# Patient Record
Sex: Female | Born: 1990 | Race: White | Hispanic: No | Marital: Single | Smoking: Former smoker
Health system: Southern US, Community
[De-identification: ages and names within clinical notes are randomized; demographics above are authoritative.]

## PROBLEM LIST (undated history)

## (undated) DIAGNOSIS — Z950 Presence of cardiac pacemaker: Secondary | ICD-10-CM

## (undated) DIAGNOSIS — Z954 Presence of other heart-valve replacement: Secondary | ICD-10-CM

## (undated) DIAGNOSIS — Z953 Presence of xenogenic heart valve: Secondary | ICD-10-CM

## (undated) DIAGNOSIS — F1990 Other psychoactive substance use, unspecified, uncomplicated: Secondary | ICD-10-CM

## (undated) DIAGNOSIS — Z8679 Personal history of other diseases of the circulatory system: Secondary | ICD-10-CM

## (undated) DIAGNOSIS — I509 Heart failure, unspecified: Secondary | ICD-10-CM

## (undated) DIAGNOSIS — Z87898 Personal history of other specified conditions: Secondary | ICD-10-CM

## (undated) HISTORY — PX: PACEMAKER INSERTION: SHX728

## (undated) HISTORY — PX: VALVE REPLACEMENT: SUR13

---

## 2016-02-24 ENCOUNTER — Emergency Department (HOSPITAL_COMMUNITY)
Admission: EM | Admit: 2016-02-24 | Discharge: 2016-02-25 | Disposition: A | Discharge status: Home / Self Care | Payer: Self-pay | Attending: Emergency Medicine | Admitting: Emergency Medicine

## 2016-02-24 ENCOUNTER — Emergency Department (HOSPITAL_COMMUNITY): Payer: Self-pay

## 2016-02-24 ENCOUNTER — Encounter (HOSPITAL_COMMUNITY): Payer: Self-pay | Admitting: Emergency Medicine

## 2016-02-24 DIAGNOSIS — F17210 Nicotine dependence, cigarettes, uncomplicated: Secondary | ICD-10-CM | POA: Insufficient documentation

## 2016-02-24 DIAGNOSIS — R079 Chest pain, unspecified: Secondary | ICD-10-CM | POA: Insufficient documentation

## 2016-02-24 DIAGNOSIS — R0602 Shortness of breath: Secondary | ICD-10-CM | POA: Insufficient documentation

## 2016-02-24 HISTORY — DX: Heart failure, unspecified: I50.9

## 2016-02-24 LAB — BASIC METABOLIC PANEL
ANION GAP: 9 (ref 5–15)
BUN: 17 mg/dL (ref 6–20)
CALCIUM: 9.3 mg/dL (ref 8.9–10.3)
CO2: 24 mmol/L (ref 22–32)
Chloride: 108 mmol/L (ref 101–111)
Creatinine, Ser: 0.75 mg/dL (ref 0.44–1.00)
Glucose, Bld: 97 mg/dL (ref 65–99)
POTASSIUM: 3.5 mmol/L (ref 3.5–5.1)
Sodium: 141 mmol/L (ref 135–145)

## 2016-02-24 LAB — I-STAT TROPONIN, ED: TROPONIN I, POC: 0 ng/mL (ref 0.00–0.08)

## 2016-02-24 LAB — CBC
HEMATOCRIT: 37.7 % (ref 36.0–46.0)
HEMOGLOBIN: 12.4 g/dL (ref 12.0–15.0)
MCH: 29.9 pg (ref 26.0–34.0)
MCHC: 32.9 g/dL (ref 30.0–36.0)
MCV: 90.8 fL (ref 78.0–100.0)
Platelets: 229 10*3/uL (ref 150–400)
RBC: 4.15 MIL/uL (ref 3.87–5.11)
RDW: 13.6 % (ref 11.5–15.5)
WBC: 8.1 10*3/uL (ref 4.0–10.5)

## 2016-02-24 LAB — BRAIN NATRIURETIC PEPTIDE: B Natriuretic Peptide: 36.7 pg/mL (ref 0.0–100.0)

## 2016-02-24 MED ORDER — GI COCKTAIL ~~LOC~~
30.0000 mL | Freq: Once | ORAL | Status: AC
  Administered 2016-02-25: 30 mL via ORAL
  Filled 2016-02-24: qty 30

## 2016-02-24 NOTE — ED Provider Notes (Signed)
Emergency Department Provider Note   I have reviewed the triage vital signs and the nursing notes.   HISTORY  Chief Complaint Chest Pain   HPI Courtney Dawson is a 26 y.o. female with PMH of CHF s/p valve repair with reported AICD presents to the ED with sudden onset CP and heart palpitations. The patient has not been compliant with CHF medications for the last 2 months because she "just stopped taking it." She reports some associated SOB. Symptoms started 1 hour PTA while watching TV and have been constant since. No exacerbating or alleviating factors. No fevers. CP is pleuritic. No history of PE but with does note a family history of blood clots, unsure of the exact diagnosis.   Past Medical History:  Diagnosis Date  . Heart failure (HCC)     There are no active problems to display for this patient.   History reviewed. No pertinent surgical history.    Allergies Contrast media [iodinated diagnostic agents] and Ceclor [cefaclor]  No family history on file.  Social History Social History  Substance Use Topics  . Smoking status: Current Some Day Smoker    Packs/day: 0.50    Types: Cigarettes  . Smokeless tobacco: Not on file  . Alcohol use No    Review of Systems  10-point ROS otherwise negative.  ____________________________________________   PHYSICAL EXAM:  VITAL SIGNS: ED Triage Vitals  Enc Vitals Group     BP 02/24/16 2200 109/80     Pulse Rate 02/24/16 2200 97     Resp 02/24/16 2200 (!) 28     Temp 02/24/16 2155 98.4 F (36.9 C)     Temp Source 02/24/16 2155 Oral     SpO2 02/24/16 2149 96 %     Weight 02/24/16 2200 120 lb (54.4 kg)     Height 02/24/16 2200 5' (1.524 m)     Pain Score 02/24/16 2200 8   Constitutional: Alert and oriented. Well appearing and in no acute distress. Eyes: Conjunctivae are normal.  Head: Atraumatic. Nose: No congestion/rhinnorhea. Mouth/Throat: Mucous membranes are moist.  Oropharynx non-erythematous. Neck: No  stridor.   Cardiovascular: Normal rate, regular rhythm. Good peripheral circulation. Grossly normal heart sounds.   Respiratory: Normal respiratory effort.  No retractions. Lungs CTAB. Gastrointestinal: Soft and nontender. No distention.  Musculoskeletal: No lower extremity tenderness nor edema. No gross deformities of extremities. Neurologic:  Normal speech and language. No gross focal neurologic deficits are appreciated.  Skin:  Skin is warm, dry and intact. No rash noted. Scar over neck and sternum.  Psychiatric: Mood and affect are normal. Speech and behavior are normal.  ____________________________________________   LABS (all labs ordered are listed, but only abnormal results are displayed)  Labs Reviewed  BASIC METABOLIC PANEL  CBC  BRAIN NATRIURETIC PEPTIDE  I-STAT TROPOININ, ED  I-STAT BETA HCG BLOOD, ED ([facility], [facility], [facility] ONLY)   ____________________________________________  EKG   EKG Interpretation  Date/Time:  Sunday February 24 2016 21:58:11 EST Ventricular Rate:  87 PR Interval:    QRS Duration: 104 QT Interval:  439 QTC Calculation: 529 R Axis:   108 Text Interpretation:  Sinus rhythm Probable left atrial enlargement Borderline right axis deviation RSR' in V1 or V2, probably normal variant Nonspecific T abnormalities, lateral leads Prolonged QT interval Baseline wander in lead(s) I II aVR aVL aVF V1 V2 V3 V4 V5 V6 No STEMI.  Confirmed by ZAMMIT  MD, JOSEPH [phone]) on 02/25/2016 3:26:39 PM       ____________________________________________  RADIOLOGY  CXR:     FINDINGS:  Right-sided duo lead pacing device. Post sternotomy changes with  valvular prosthesis. Additional linear leads overlie the lower  anterior chest. No acute consolidation or large pleural effusion.  There is mild cardiomegaly but no overt failure. There is no  pneumothorax.    IMPRESSION:  Mild cardiomegaly. No edema. No acute infiltrate.      Electronically Signed  By: Jasmine Pang M.D.  On: 02/24/2016 23:22    ____________________________________________   PROCEDURES  Procedure(s) performed:   Procedures  None ____________________________________________   INITIAL IMPRESSION / ASSESSMENT AND PLAN / ED COURSE  Pertinent labs & imaging results that were available during my care of the patient were reviewed by me and considered in my medical decision making (see chart for details).  Patient presents to the ED for evaluation of chest pain starting 1 hour PTA. Patient is in sinus rhythm on EKG. Patient has an extensive heart history that involves valve repair, CHF, and reported AICD/Paceamker. The patient reports seeing a Cardiologist at [facility] in the recent past but cannot recall the name of the physician or group. No records available on Care Everywhere. Last hospital prior to this was [facility] [place] with no records available there. Patient in no acute distress with no evidence of volume overload. No hypoxemia.  11:30 PM Patient with unremarkable labs and CXR. Reports continued CP worse with deep breathing. No history of blood clot but does note a family history. Patient with coughing and intermittent tachycardia. With family history and tachycardia along with pleuritic CP will obtain CT angio of the chest to evaluate for PE. Discussed this with the patient. If negative, would discharge home with Cardiology follow up.   12:07 AM Patient's PCP found to be Dr. Thera Flake. Patient med list updated by pharmacy.   Care transferred to Dr. Mora Bellman pending CTA. If negative for clot would consider discharge with patient plan to follow with Cardiology and PCP as an outpatient and re-start her medication. I spent a Jong Rickman time discussing her condition and the importance to maintaining good medical compliance to prevent further morbidity or mortality.  ____________________________________________  FINAL CLINICAL IMPRESSION(S) / ED DIAGNOSES  Final  diagnoses:  Nonspecific chest pain     MEDICATIONS GIVEN DURING THIS VISIT:  Medications  gi cocktail (Maalox,Lidocaine,Donnatal) (30 mLs Oral Given 02/25/16 0044)  hydrocortisone sodium succinate (SOLU-CORTEF) 100 MG injection 200 mg (200 mg Intravenous Given 02/25/16 0043)  diphenhydrAMINE (BENADRYL) injection 50 mg (50 mg Intravenous Given 02/25/16 0351)  iopamidol (ISOVUE-370) 76 % injection (100 mLs  Contrast Given 02/25/16 0453)     NEW OUTPATIENT MEDICATIONS STARTED DURING THIS VISIT:  There are no discharge medications for this patient.     Note:  This document was prepared using Dragon voice recognition software and may include unintentional dictation errors.  Alona Bene, MD Emergency Medicine   Maia Plan, MD 02/26/16 [phone]

## 2016-02-24 NOTE — ED Notes (Signed)
Patient transported to X-ray 

## 2016-02-24 NOTE — ED Triage Notes (Signed)
Tonight pt was sitting and experienced sudden onset left sided chest pain, w/ SOB.  She has an extensive cardiac history (2 valves replaced and heart failure w/ ECF of 30% including a demand pacemaker, however she has not been complaint w/ medications for two months.  She was given 324 ASA and 1 nitro which did not change the pressure of 8-10.  She has had a mildly productive cough for a few days.

## 2016-02-25 ENCOUNTER — Emergency Department (HOSPITAL_COMMUNITY): Payer: Self-pay

## 2016-02-25 LAB — I-STAT BETA HCG BLOOD, ED (MC, WL, AP ONLY)

## 2016-02-25 MED ORDER — HYDROCORTISONE NA SUCCINATE PF 100 MG IJ SOLR
200.0000 mg | Freq: Once | INTRAMUSCULAR | Status: AC
  Administered 2016-02-25: 200 mg via INTRAVENOUS
  Filled 2016-02-25: qty 4

## 2016-02-25 MED ORDER — IOPAMIDOL (ISOVUE-370) INJECTION 76%
INTRAVENOUS | Status: AC
  Administered 2016-02-25: 100 mL
  Filled 2016-02-25: qty 100

## 2016-02-25 MED ORDER — DIPHENHYDRAMINE HCL 50 MG/ML IJ SOLN
50.0000 mg | Freq: Once | INTRAMUSCULAR | Status: AC
  Administered 2016-02-25: 50 mg via INTRAVENOUS
  Filled 2016-02-25: qty 1

## 2016-02-25 NOTE — ED Provider Notes (Signed)
Patient signed out to me pending CTA for evaluation of PE.  CT results are negative for clots.  She has been stable in the ED throughout the night without any changed to her hemodynamics.  Plan to DC home with follow up.  She appears well and in NAD. VS remain within her normal limits and she is safe for DC.   Tomasita CrumbleAdeleke Dyland Panuco, MD 02/25/16 [phone]

## 2016-02-25 NOTE — ED Notes (Signed)
Spoke to pt several times about the importance of leaving the vital monitoring equipment on.  Several times I have entered the room and found that she has removed the equipment.  Requested that she please leave them on.

## 2016-02-25 NOTE — ED Notes (Signed)
Pt given cab voucher to get home safely

## 2016-02-25 NOTE — Discharge Instructions (Addendum)
You were seen in the ED today with chest pain. The CT scan and lab work was reassuring and did not show any blood clots in your lungs. You need to restart your mediations immediately and follow up with your PCP and Cardiology teams ASAP.   You should come back to the ED immediately with any new/worsening chest pain, difficulty breathing, heart palpitations, or other concerning symptoms.

## 2016-02-25 NOTE — ED Notes (Signed)
Pt back from CT, Tech states pt tolerated procedure well.

## 2016-05-10 ENCOUNTER — Emergency Department (HOSPITAL_BASED_OUTPATIENT_CLINIC_OR_DEPARTMENT_OTHER): Payer: 59

## 2016-05-10 ENCOUNTER — Emergency Department (HOSPITAL_COMMUNITY)
Admission: EM | Admit: 2016-05-10 | Discharge: 2016-05-10 | Disposition: A | Discharge status: Home / Self Care | Payer: 59 | Attending: Emergency Medicine | Admitting: Emergency Medicine

## 2016-05-10 ENCOUNTER — Emergency Department (HOSPITAL_COMMUNITY): Payer: 59

## 2016-05-10 ENCOUNTER — Encounter (HOSPITAL_COMMUNITY): Payer: Self-pay | Admitting: Emergency Medicine

## 2016-05-10 DIAGNOSIS — R0602 Shortness of breath: Secondary | ICD-10-CM

## 2016-05-10 DIAGNOSIS — F17210 Nicotine dependence, cigarettes, uncomplicated: Secondary | ICD-10-CM | POA: Insufficient documentation

## 2016-05-10 DIAGNOSIS — Z3A01 Less than 8 weeks gestation of pregnancy: Secondary | ICD-10-CM | POA: Insufficient documentation

## 2016-05-10 DIAGNOSIS — R072 Precordial pain: Secondary | ICD-10-CM

## 2016-05-10 DIAGNOSIS — O9989 Other specified diseases and conditions complicating pregnancy, childbirth and the puerperium: Secondary | ICD-10-CM | POA: Insufficient documentation

## 2016-05-10 DIAGNOSIS — O99331 Smoking (tobacco) complicating pregnancy, first trimester: Secondary | ICD-10-CM | POA: Insufficient documentation

## 2016-05-10 DIAGNOSIS — I360 Nonrheumatic tricuspid (valve) stenosis: Secondary | ICD-10-CM | POA: Diagnosis not present

## 2016-05-10 DIAGNOSIS — R079 Chest pain, unspecified: Secondary | ICD-10-CM | POA: Insufficient documentation

## 2016-05-10 DIAGNOSIS — I509 Heart failure, unspecified: Secondary | ICD-10-CM | POA: Insufficient documentation

## 2016-05-10 LAB — CBC
HCT: 39.9 % (ref 36.0–46.0)
HEMOGLOBIN: 13.2 g/dL (ref 12.0–15.0)
MCH: 30 pg (ref 26.0–34.0)
MCHC: 33.1 g/dL (ref 30.0–36.0)
MCV: 90.7 fL (ref 78.0–100.0)
PLATELETS: 223 10*3/uL (ref 150–400)
RBC: 4.4 MIL/uL (ref 3.87–5.11)
RDW: 13.5 % (ref 11.5–15.5)
WBC: 7.3 10*3/uL (ref 4.0–10.5)

## 2016-05-10 LAB — BASIC METABOLIC PANEL
ANION GAP: 8 (ref 5–15)
BUN: 11 mg/dL (ref 6–20)
CALCIUM: 9 mg/dL (ref 8.9–10.3)
CO2: 21 mmol/L — AB (ref 22–32)
CREATININE: 0.53 mg/dL (ref 0.44–1.00)
Chloride: 106 mmol/L (ref 101–111)
Glucose, Bld: 90 mg/dL (ref 65–99)
Potassium: 3.8 mmol/L (ref 3.5–5.1)
SODIUM: 135 mmol/L (ref 135–145)

## 2016-05-10 LAB — TROPONIN I

## 2016-05-10 LAB — ECHOCARDIOGRAM COMPLETE

## 2016-05-10 LAB — I-STAT TROPONIN, ED: Troponin i, poc: 0 ng/mL (ref 0.00–0.08)

## 2016-05-10 LAB — BRAIN NATRIURETIC PEPTIDE: B NATRIURETIC PEPTIDE 5: 54.5 pg/mL (ref 0.0–100.0)

## 2016-05-10 LAB — D-DIMER, QUANTITATIVE: D-Dimer, Quant: 0.36 ug/mL-FEU (ref 0.00–0.50)

## 2016-05-10 NOTE — Discharge Instructions (Signed)
Please continue taking all of her cardiac medications especially your Lasix, digoxin, Coreg. Please call her cardiologist asap for close follow up Monday or Tuesday

## 2016-05-10 NOTE — ED Triage Notes (Signed)
Pt c/o CP since this morning, c/o trouble breathing, pt states she has heart failure.

## 2016-05-10 NOTE — Progress Notes (Signed)
  Echocardiogram 2D Echocardiogram has been performed.  Arvil ChacoFoster, Maven Varelas 05/10/2016, 3:07 PM

## 2016-05-10 NOTE — ED Notes (Signed)
Pt ambulated in hallway, sats 98% RA, 98bpm.

## 2016-05-10 NOTE — ED Provider Notes (Signed)
Hx of IVDU. Complex cardiac history. Here with sharp pleuritic cp. Labs negative. Pregnant 1 month. Needs echo   Echo with mild RV dilation and possible paradoxical septal movement. Discussed this with cardiology extensively who does not believe any further testing or evaluation is needed at this time. Patient is feeling better and is ambulatory on room air without tachycardia and maintaining her saturations. Given lab values I do not believe she has ACS or PE. She does tell me that she has stopped all of her medications which include Lasix, digoxin, Coreg. She has not followed up with her heart failure team in regards to this. I advised that she needs to have a good conversation with her heart failure team first day next week. She does feel comfortable discharge at this time. We'll follow up on Monday or Tuesday with her cardiologist.  I have reviewed all labs and workup. Patient stable for discharge home.  I have reviewed all results with the patient. Patient agrees to stated plan. All questions answered. Advised to call or return to have any questions, new symptoms, change in symptoms, or symptoms that they do not understand.    Courtney Dawson, Courtney Ringle, MD 05/11/16 1714    Blane OharaZavitz, Joshua, MD 05/12/16 [phone]

## 2016-05-10 NOTE — ED Provider Notes (Signed)
[facility]ENCY DEPT Provider Note   CSN: [id number] Arrival date & time: 05/10/16  [id number]     History   Chief Complaint Chief Complaint  Patient presents with  . Chest Pain    HPI Courtney Dawson is a 26 y.o. female  With a pmh of IVDU, currently resisding in a rehab facility who is currently about 1 month pregnant. 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 She had trach and ped removed after discharge. In 05/2015 she was admitted for biventricular HF, was diuresed and last echo shoed EF of 35-40%.    The patient presents today with cc of cp. She states that she awoke with cp that she states is sharp and worse with breathing She says that it is different from her heart failure and different from the normal cp she has after the multiple surgeries. She denies Hemoptysis or UL leg swelling. She denies    HPI  Past Medical History:  Diagnosis Date  . Heart failure (HCC)     There are no active problems to display for this patient.   History reviewed. No pertinent surgical history.  OB History    Gravida Para Term Preterm AB Living   1             SAB TAB Ectopic Multiple Live Births                   Home Medications    Prior to Admission medications   Not on File    Family History No family history on file.  Social History Social History  Substance Use Topics  . Smoking status: Current Some Day Smoker    Packs/day: 0.50    Types: Cigarettes  . Smokeless tobacco: Not on file  . Alcohol use No     Allergies   Contrast media [iodinated diagnostic agents] and Ceclor [cefaclor]   Review of Systems Review of Systems  Ten systems reviewed and are negative for acute change, except as noted in the HPI.   Physical Exam Updated Vital Signs BP 96/64 (BP Location: Left Arm)   Pulse 97   Temp 98.4 F  (36.9 C) (Oral)   Resp 20   LMP 02/16/2016 (Exact Date)   SpO2 98%   Physical Exam  Constitutional: She is oriented to person, place, and time. She appears well-developed and well-nourished. No distress.  HENT:  Head: Normocephalic and atraumatic.  Eyes: Conjunctivae are normal. No scleral icterus.  Neck: Normal range of motion.  Cardiovascular: Normal rate, regular rhythm and normal heart sounds.  Exam reveals no gallop and no friction rub.   No murmur heard. Pulmonary/Chest: Effort normal and breath sounds normal. No respiratory distress.  Abdominal: Soft. Bowel sounds are normal. She exhibits no distension and no mass. There is no tenderness. There is no guarding.  Neurological: She is alert and oriented to person, place, and time.  Skin: Skin is warm and dry. She is not diaphoretic.  Psychiatric: Her behavior is normal.  Nursing note and vitals reviewed.    ED Treatments / Results  Labs (all labs ordered are listed, but only abnormal results are displayed) Labs Reviewed  BASIC METABOLIC PANEL  CBC  BRAIN NATRIURETIC PEPTIDE  D-DIMER, QUANTITATIVE (NOT [facility])  I-STAT TROPOININ, ED    EKG  EKG Interpretation None       Radiology  No results found.  Procedures Procedures (including critical care time)  Medications Ordered in ED Medications - No data to display   Initial Impression / Assessment and Plan / ED Course  I have reviewed the triage vital signs and the nursing notes.  Pertinent labs & imaging results that were available during my care of the patient were reviewed by me and considered in my medical decision making (see chart for details).       High risk cp patient work up is negative.  Stat echocardiogram is currently pending. Have given sign out to Dr. Armond Hang. He'll assume care of the patient. We will get a repeat troponin and patient disposition will be based on file. Echocardiogram results. Final Clinical Impressions(s) / ED Diagnoses    Final diagnoses:  None    New Prescriptions New Prescriptions   No medications on file     Arthor Captain, PA-C 05/10/16 1630    Tegeler, Canary Brim, MD 05/10/16 [phone]

## 2016-05-10 NOTE — ED Triage Notes (Signed)
Pt states she is 1 month pregnant but not planning on keeping it.

## 2016-05-27 ENCOUNTER — Encounter (HOSPITAL_COMMUNITY): Payer: Self-pay

## 2016-05-27 ENCOUNTER — Emergency Department (HOSPITAL_COMMUNITY): Payer: 59

## 2016-05-27 DIAGNOSIS — I509 Heart failure, unspecified: Secondary | ICD-10-CM | POA: Diagnosis not present

## 2016-05-27 DIAGNOSIS — Z79899 Other long term (current) drug therapy: Secondary | ICD-10-CM | POA: Diagnosis not present

## 2016-05-27 DIAGNOSIS — Z7901 Long term (current) use of anticoagulants: Secondary | ICD-10-CM | POA: Insufficient documentation

## 2016-05-27 DIAGNOSIS — F17210 Nicotine dependence, cigarettes, uncomplicated: Secondary | ICD-10-CM | POA: Diagnosis not present

## 2016-05-27 DIAGNOSIS — R0789 Other chest pain: Secondary | ICD-10-CM | POA: Diagnosis not present

## 2016-05-27 DIAGNOSIS — R091 Pleurisy: Secondary | ICD-10-CM | POA: Diagnosis not present

## 2016-05-27 DIAGNOSIS — Z950 Presence of cardiac pacemaker: Secondary | ICD-10-CM | POA: Diagnosis not present

## 2016-05-27 DIAGNOSIS — Z7982 Long term (current) use of aspirin: Secondary | ICD-10-CM | POA: Insufficient documentation

## 2016-05-27 DIAGNOSIS — R05 Cough: Secondary | ICD-10-CM | POA: Insufficient documentation

## 2016-05-27 LAB — BASIC METABOLIC PANEL
Anion gap: 8 (ref 5–15)
BUN: 12 mg/dL (ref 6–20)
CHLORIDE: 107 mmol/L (ref 101–111)
CO2: 22 mmol/L (ref 22–32)
CREATININE: 0.85 mg/dL (ref 0.44–1.00)
Calcium: 9.2 mg/dL (ref 8.9–10.3)
GFR calc Af Amer: >60 mL/min (ref >60)
GLUCOSE: 104 mg/dL — AB (ref 65–99)
Potassium: 4.1 mmol/L (ref 3.5–5.1)
SODIUM: 137 mmol/L (ref 135–145)

## 2016-05-27 LAB — CBC WITH DIFFERENTIAL/PLATELET
Basophils Absolute: 0 10*3/uL (ref 0.0–0.1)
Basophils Relative: 0 %
EOS ABS: 0.2 10*3/uL (ref 0.0–0.7)
EOS PCT: 3 %
HCT: 38.2 % (ref 36.0–46.0)
Hemoglobin: 12.5 g/dL (ref 12.0–15.0)
LYMPHS ABS: 2.8 10*3/uL (ref 0.7–4.0)
Lymphocytes Relative: 31 %
MCH: 29.6 pg (ref 26.0–34.0)
MCHC: 32.7 g/dL (ref 30.0–36.0)
MCV: 90.5 fL (ref 78.0–100.0)
MONOS PCT: 6 %
Monocytes Absolute: 0.6 10*3/uL (ref 0.1–1.0)
Neutro Abs: 5.5 10*3/uL (ref 1.7–7.7)
Neutrophils Relative %: 60 %
PLATELETS: 246 10*3/uL (ref 150–400)
RBC: 4.22 MIL/uL (ref 3.87–5.11)
RDW: 13.4 % (ref 11.5–15.5)
WBC: 9.1 10*3/uL (ref 4.0–10.5)

## 2016-05-27 LAB — BRAIN NATRIURETIC PEPTIDE: B Natriuretic Peptide: 34.8 pg/mL (ref 0.0–100.0)

## 2016-05-27 LAB — I-STAT TROPONIN, ED: TROPONIN I, POC: 0.01 ng/mL (ref 0.00–0.08)

## 2016-05-27 LAB — D-DIMER, QUANTITATIVE: D-Dimer, Quant: 0.42 ug/mL-FEU (ref 0.00–0.50)

## 2016-05-27 NOTE — ED Triage Notes (Signed)
Pt presents with sudden onset of L sided chest pain while she was at work.  Pt reports shortness of breath, reports feeling abdominal bloating.  Pt reports medicine induced abortion x 1 week ago, reports pacemaker placement 1 year ago, doesn't remember the last time it was interrogated.  +dry cough.

## 2016-05-28 ENCOUNTER — Emergency Department (HOSPITAL_COMMUNITY)
Admission: EM | Admit: 2016-05-28 | Discharge: 2016-05-28 | Disposition: A | Discharge status: Home / Self Care | Payer: 59 | Attending: Emergency Medicine | Admitting: Emergency Medicine

## 2016-05-28 DIAGNOSIS — R0789 Other chest pain: Secondary | ICD-10-CM

## 2016-05-28 DIAGNOSIS — R091 Pleurisy: Secondary | ICD-10-CM

## 2016-05-28 MED ORDER — BENZONATATE 100 MG PO CAPS: 100.0000 mg | ORAL_CAPSULE | Freq: Three times a day (TID) | ORAL | 0 refills | Status: DC

## 2016-05-28 MED ORDER — ALBUTEROL SULFATE HFA 108 (90 BASE) MCG/ACT IN AERS: 2.0000 | INHALATION_SPRAY | RESPIRATORY_TRACT | 2 refills | Status: DC | PRN

## 2016-05-28 MED ORDER — PREDNISONE 20 MG PO TABS: 40.0000 mg | ORAL_TABLET | Freq: Every day | ORAL | 0 refills | Status: DC

## 2016-05-28 MED ORDER — HYDROCODONE-HOMATROPINE 5-1.5 MG/5ML PO SYRP: 5.0000 mL | ORAL_SOLUTION | Freq: Once | ORAL | Status: DC

## 2016-05-28 MED ORDER — PREDNISONE 20 MG PO TABS
60.0000 mg | ORAL_TABLET | Freq: Once | ORAL | Status: AC
  Administered 2016-05-28: 60 mg via ORAL
  Filled 2016-05-28: qty 3

## 2016-05-28 MED ORDER — ALBUTEROL SULFATE (2.5 MG/3ML) 0.083% IN NEBU
5.0000 mg | INHALATION_SOLUTION | Freq: Once | RESPIRATORY_TRACT | Status: AC
  Administered 2016-05-28: 5 mg via RESPIRATORY_TRACT
  Filled 2016-05-28: qty 6

## 2016-05-28 MED ORDER — KETOROLAC TROMETHAMINE 30 MG/ML IJ SOLN
30.0000 mg | Freq: Once | INTRAMUSCULAR | Status: AC
  Administered 2016-05-28: 30 mg via INTRAMUSCULAR
  Filled 2016-05-28: qty 1

## 2016-05-28 NOTE — ED Provider Notes (Signed)
[facility]-EMERGENCY DEPT Provider Note   CSN: [id number] Arrival date & time: 05/27/16  1658  By signing my name below, I, Courtney Dawson, attest that this documentation has been prepared under the direction and in the presence of Tandi Spilde, Canary Brim, *. Electronically Signed: Karren Cobble, ED Scribe. 05/28/16. 2:19 AM.   History   Chief Complaint Chief Complaint  Patient presents with  . Chest Pain   The history is provided by the patient. No language interpreter was used.    HPI Comments: Courtney Dawson is a 26 y.o. female with a PMHx of heart failure and pacemaker, who presents to the Emergency Department complaining of sudden onset of lower left sided sharp and heavy chest pain that started today.  Pt notes an associated cough that started three weeks ago and back pain. She reports seeing her cardiologist for her cough previously but denies the presence of chest pain at that time. She also notes pain near her pacemaker site. Pain is worsened with coughing and while laying flat.No treatment tried PTA. No alleviating factors. Denies worsening of symptoms with movement or palpation of the area. Denies shortness of breath.    Past Medical History:  Diagnosis Date  . Heart failure (HCC)    There are no active problems to display for this patient.  History reviewed. No pertinent surgical history.  OB History    Gravida Para Term Preterm AB Living   1             SAB TAB Ectopic Multiple Live Births                   Home Medications    Prior to Admission medications   Medication Sig Start Date End Date Taking? Authorizing Provider  albuterol (PROVENTIL HFA;VENTOLIN HFA) 108 (90 Base) MCG/ACT inhaler Inhale 1-2 puffs into the lungs every 6 (six) hours as needed for wheezing or shortness of breath.   Yes [provider]  aspirin EC 81 MG tablet Take 81 mg by mouth daily.   Yes [provider]  carvedilol (COREG) 12.5 MG tablet Take 18.75 mg by mouth 2 (two) times  daily with a meal.   Yes [provider]  digoxin (LANOXIN) 0.125 MG tablet Take 0.125 mg by mouth every Monday, Wednesday, and Friday.   Yes [provider]  ferrous sulfate 325 (65 FE) MG tablet Take 325 mg by mouth daily with breakfast.   Yes [provider]  furosemide (LASIX) 20 MG tablet Take 20 mg by mouth daily.   Yes [provider]  lamoTRIgine (LAMICTAL) 100 MG tablet Take 100 mg by mouth daily.   Yes [provider]  lisinopril (PRINIVIL,ZESTRIL) 2.5 MG tablet Take 2.5 mg by mouth daily.   Yes [provider]  Multiple Vitamin (MULTIVITAMIN WITH MINERALS) TABS tablet Take 1 tablet by mouth daily.   Yes [provider]  sertraline (ZOLOFT) 100 MG tablet Take 100 mg by mouth daily.   Yes [provider]  albuterol (PROVENTIL HFA;VENTOLIN HFA) 108 (90 Base) MCG/ACT inhaler Inhale 2 puffs into the lungs every 4 (four) hours as needed for wheezing or shortness of breath. 05/28/16   Courtney Dawson, Canary Brim, MD  benzonatate (TESSALON) 100 MG capsule Take 1 capsule (100 mg total) by mouth every 8 (eight) hours. 05/28/16   Courtney Crease, MD  predniSONE (DELTASONE) 20 MG tablet Take 2 tablets (40 mg total) by mouth daily with breakfast. 05/28/16   Courtney Dawson, Canary Brim, MD  Family History History reviewed. No pertinent family history.  Social History Social History  Substance Use Topics  . Smoking status: Current Some Day Smoker    Packs/day: 0.50    Types: Cigarettes  . Smokeless tobacco: Never Used  . Alcohol use No     Allergies   Contrast media [iodinated diagnostic agents] and Ceclor [cefaclor]   Review of Systems Review of Systems  Respiratory: Positive for cough. Negative for shortness of breath.   Cardiovascular: Positive for chest pain.  Musculoskeletal: Positive for back pain.  All other systems reviewed and are negative.    Physical Exam Updated Vital Signs BP 102/67 (BP Location:  Right Arm)   Pulse 74   Temp 99 F (37.2 C) (Oral)   Resp 16   Ht 5' (1.524 m)   Wt 54.4 kg (120 lb)   LMP 02/16/2016 (Exact Date)   SpO2 100%   Breastfeeding? No Comment: pt not currently pregnant  BMI 23.44 kg/m   Physical Exam  Constitutional: She is oriented to person, place, and time. She appears well-developed and well-nourished. No distress.  HENT:  Head: Normocephalic and atraumatic.  Right Ear: Hearing normal.  Left Ear: Hearing normal.  Nose: Nose normal.  Mouth/Throat: Oropharynx is clear and moist and mucous membranes are normal.  Eyes: Conjunctivae and EOM are normal. Pupils are equal, round, and reactive to light.  Neck: Normal range of motion. Neck supple.  Cardiovascular: Regular rhythm, S1 normal and S2 normal.  Exam reveals no gallop and no friction rub.   No murmur heard. Pulmonary/Chest: Effort normal and breath sounds normal. No respiratory distress. She exhibits no tenderness.  Scattered rhonchi.   Abdominal: Soft. Normal appearance and bowel sounds are normal. There is no hepatosplenomegaly. There is no tenderness. There is no rebound, no guarding, no tenderness at McBurney's point and negative Murphy's sign. No hernia.  Musculoskeletal: Normal range of motion.  Neurological: She is alert and oriented to person, place, and time. She has normal strength. No cranial nerve deficit or sensory deficit. Coordination normal. GCS eye subscore is 4. GCS verbal subscore is 5. GCS motor subscore is 6.  Skin: Skin is warm, dry and intact. No rash noted. No cyanosis.  Psychiatric: She has a normal mood and affect. Her speech is normal and behavior is normal. Thought content normal.  Nursing note and vitals reviewed.    ED Treatments / Results  DIAGNOSTIC STUDIES: Oxygen Saturation is 100% on RA, normal by my interpretation.   COORDINATION OF CARE: 1:08 AM-Discussed next steps with pt. Pt verbalized understanding and is agreeable with the plan.   Labs (all labs  ordered are listed, but only abnormal results are displayed) Labs Reviewed  BASIC METABOLIC PANEL - Abnormal; Notable for the following:       Result Value   Glucose, Bld 104 (*)    All other components within normal limits  CBC WITH DIFFERENTIAL/PLATELET  D-DIMER, QUANTITATIVE (NOT AT [facility])  BRAIN NATRIURETIC PEPTIDE  I-STAT TROPOININ, ED    EKG  EKG Interpretation  Date/Time:  Tuesday May 27 2016 17:03:28 EDT Ventricular Rate:  106 PR Interval:  218 QRS Duration: 84 QT Interval:  340 QTC Calculation: 451 R Axis:   113 Text Interpretation:  Sinus tachycardia with 1st degree A-V block Right axis deviation Nonspecific T wave abnormality Abnormal ECG No significant change since last tracing Confirmed by Courtney Dawson [phone]) on 05/28/2016 12:58:50 AM       Radiology Dg Chest 2 View  Result  Date: 05/27/2016 CLINICAL DATA:  Chest pain EXAM: CHEST  2 VIEW COMPARISON:  05/10/2016 FINDINGS: Mitral valve and tricuspid valve replacement. Duo lead pacemaker unchanged. Heart size mildly enlarged. Negative for heart failure. Lungs are clear without infiltrate or effusion. IMPRESSION: No active cardiopulmonary disease. Electronically Signed   By: Marlan Palauharles  Clark M.D.   On: 05/27/2016 17:58    Procedures Procedures (including critical care time)  Medications Ordered in ED Medications  predniSONE (DELTASONE) tablet 60 mg (60 mg Oral Given 05/28/16 0133)  albuterol (PROVENTIL) (2.5 MG/3ML) 0.083% nebulizer solution 5 mg (5 mg Nebulization Given 05/28/16 0133)  ketorolac (TORADOL) 30 MG/ML injection 30 mg (30 mg Intramuscular Given 05/28/16 0131)     Initial Impression / Assessment and Plan / ED Course  I have reviewed the triage vital signs and the nursing notes.  Pertinent labs & imaging results that were available during my care of the patient were reviewed by me and considered in my medical decision making (see chart for details).     Patient presents to the emergency  department with concerns over chest pain. This is one of multiple visits for this complaint recently. 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 She reports pain associated with cough. She does have a violent and frequent cough during the examination. This is most likely resulting in chest wall and lung inflammation, possibly pleurisy. Chest x-ray, however, is negative. No evidence of pneumonia. Troponin negative. No evidence of heart failure by x-ray, examination or BNP. D-dimer also normal. Patient recently had cardiac echo during her previous ER evaluation that did not show any significant abnormality. There is nothing to indicate cardiac etiology of her chest pain. Will treat with prednisone and bronchodilator therapy.  Of note, patient reports early pregnancy. She reports intent to terminate pregnancy. It was therefore felt that she could be treated with anti-inflammatory medication for her chest wall pain.  Final Clinical Impressions(s) / ED Diagnoses   Final diagnoses:  Chest wall pain  Pleurisy    New Prescriptions New Prescriptions   ALBUTEROL (PROVENTIL HFA;VENTOLIN HFA) 108 (90 BASE) MCG/ACT INHALER    Inhale 2 puffs into the lungs every 4 (four) hours as needed for wheezing or shortness of breath.   BENZONATATE (TESSALON) 100 MG CAPSULE    Take 1 capsule (100 mg total) by mouth every 8 (eight) hours.   PREDNISONE (DELTASONE) 20 MG TABLET    Take 2 tablets (40 mg total) by mouth daily with breakfast.   I personally performed the services described in this documentation, which was scribed in my presence. The recorded information has been reviewed and is accurate.     Courtney CreasePollina, Schae Cando J, MD 05/28/16 [phone]

## 2016-07-05 ENCOUNTER — Encounter (HOSPITAL_COMMUNITY): Payer: Self-pay | Admitting: Emergency Medicine

## 2016-07-05 ENCOUNTER — Emergency Department (HOSPITAL_COMMUNITY): Payer: 59

## 2016-07-05 ENCOUNTER — Emergency Department (HOSPITAL_COMMUNITY)
Admission: EM | Admit: 2016-07-05 | Discharge: 2016-07-05 | Disposition: A | Discharge status: Home / Self Care | Payer: 59 | Attending: Emergency Medicine | Admitting: Emergency Medicine

## 2016-07-05 DIAGNOSIS — J9801 Acute bronchospasm: Secondary | ICD-10-CM

## 2016-07-05 DIAGNOSIS — Z7982 Long term (current) use of aspirin: Secondary | ICD-10-CM | POA: Insufficient documentation

## 2016-07-05 DIAGNOSIS — Z79899 Other long term (current) drug therapy: Secondary | ICD-10-CM | POA: Insufficient documentation

## 2016-07-05 DIAGNOSIS — J209 Acute bronchitis, unspecified: Secondary | ICD-10-CM

## 2016-07-05 DIAGNOSIS — F17210 Nicotine dependence, cigarettes, uncomplicated: Secondary | ICD-10-CM | POA: Insufficient documentation

## 2016-07-05 DIAGNOSIS — R0789 Other chest pain: Secondary | ICD-10-CM

## 2016-07-05 LAB — CBC
HEMATOCRIT: 37 % (ref 36.0–46.0)
Hemoglobin: 12.4 g/dL (ref 12.0–15.0)
MCH: 30.3 pg (ref 26.0–34.0)
MCHC: 33.5 g/dL (ref 30.0–36.0)
MCV: 90.5 fL (ref 78.0–100.0)
PLATELETS: 193 10*3/uL (ref 150–400)
RBC: 4.09 MIL/uL (ref 3.87–5.11)
RDW: 13.8 % (ref 11.5–15.5)
WBC: 7.9 10*3/uL (ref 4.0–10.5)

## 2016-07-05 LAB — DIFFERENTIAL
BASOS ABS: 0 10*3/uL (ref 0.0–0.1)
Basophils Relative: 0 %
EOS ABS: 0.3 10*3/uL (ref 0.0–0.7)
EOS PCT: 4 %
Lymphocytes Relative: 35 %
Lymphs Abs: 2.8 10*3/uL (ref 0.7–4.0)
Monocytes Absolute: 0.8 10*3/uL (ref 0.1–1.0)
Monocytes Relative: 10 %
NEUTROS PCT: 51 %
Neutro Abs: 4.1 10*3/uL (ref 1.7–7.7)

## 2016-07-05 LAB — BASIC METABOLIC PANEL
Anion gap: 8 (ref 5–15)
BUN: 14 mg/dL (ref 6–20)
CHLORIDE: 110 mmol/L (ref 101–111)
CO2: 22 mmol/L (ref 22–32)
CREATININE: 0.76 mg/dL (ref 0.44–1.00)
Calcium: 8.7 mg/dL — ABNORMAL LOW (ref 8.9–10.3)
Glucose, Bld: 99 mg/dL (ref 65–99)
POTASSIUM: 3.5 mmol/L (ref 3.5–5.1)
SODIUM: 140 mmol/L (ref 135–145)

## 2016-07-05 LAB — I-STAT TROPONIN, ED: Troponin i, poc: 0 ng/mL (ref 0.00–0.08)

## 2016-07-05 MED ORDER — KETOROLAC TROMETHAMINE 30 MG/ML IJ SOLN
30.0000 mg | Freq: Once | INTRAMUSCULAR | Status: AC
  Administered 2016-07-05: 30 mg via INTRAVENOUS
  Filled 2016-07-05: qty 1

## 2016-07-05 MED ORDER — IPRATROPIUM-ALBUTEROL 0.5-2.5 (3) MG/3ML IN SOLN
3.0000 mL | Freq: Once | RESPIRATORY_TRACT | Status: AC
  Administered 2016-07-05: 3 mL via RESPIRATORY_TRACT
  Filled 2016-07-05: qty 3

## 2016-07-05 MED ORDER — METHYLPREDNISOLONE SODIUM SUCC 125 MG IJ SOLR
125.0000 mg | Freq: Once | INTRAMUSCULAR | Status: AC
  Administered 2016-07-05: 125 mg via INTRAVENOUS
  Filled 2016-07-05: qty 2

## 2016-07-05 MED ORDER — ALBUTEROL SULFATE HFA 108 (90 BASE) MCG/ACT IN AERS: 2.0000 | INHALATION_SPRAY | RESPIRATORY_TRACT | 0 refills | Status: AC | PRN

## 2016-07-05 MED ORDER — PREDNISONE 50 MG PO TABS: 50.0000 mg | ORAL_TABLET | Freq: Every day | ORAL | 0 refills | Status: DC

## 2016-07-05 NOTE — ED Notes (Signed)
Bed: RESB Expected date:  Expected time:  Means of arrival:  Comments: EMS 26 yo female from home feeling anxious while lying on the couch-chest wall pain-cough x 1 week/hx CHF-pacemaker

## 2016-07-05 NOTE — ED Notes (Signed)
resp tech called for duoneb

## 2016-07-05 NOTE — ED Triage Notes (Signed)
Pt arrivied via [facility] with c/o mid chest pain burning and non-productive cough pt reports feeling chest tightness and squeezing sensation. While sitting on cough felt heart racing that was worse with exhaltion. VS 105/72 Hr 96 resp 18 sat 98% RA 12 lead ST rate 103

## 2016-07-05 NOTE — ED Provider Notes (Signed)
[facility]-EMERGENCY DEPT Provider Note   CSN: [id number] Arrival date & time: 07/05/16  8469 By signing my name below, I, Levon Hedger, attest that this documentation has been prepared under the direction and in the presence of Dione Booze, MD . Electronically Signed: Levon Hedger, Scribe. 07/05/2016. 3:35 AM.   History   Chief Complaint Chief Complaint  Patient presents with  . Chest Pain  . Cough   HPI Courtney Dawson is a 26 y.o. female with a history of heart failure s/p valve repair who presents to the Emergency Department complaining of sudden onset, constant upper sternal chest pain onset a few hours ago. She describes this as 7/10 squeezing pain; no alleviating or modifying factors noted. She reports associated palpitations, nausea, and productive cough x 1 week. Cough is exacerbated by lying flat. Pt also reports increased anxiety secondary to chest pain. No OTC treatments tried for these symptoms PTA. Pt denies any illicit drug use, but endorses tobacco use. She denies fever, chills, diaphoresis, or vomiting. Pt is not anticoagulated.   The history is provided by the patient. No language interpreter was used.    Past Medical History:  Diagnosis Date  . Heart failure (HCC)     There are no active problems to display for this patient.   History reviewed. No pertinent surgical history.  OB History    Gravida Para Term Preterm AB Living   1             SAB TAB Ectopic Multiple Live Births                   Home Medications    Prior to Admission medications   Medication Sig Start Date End Date Taking? Authorizing Provider  albuterol (PROVENTIL HFA;VENTOLIN HFA) 108 (90 Base) MCG/ACT inhaler Inhale 1-2 puffs into the lungs every 6 (six) hours as needed for wheezing or shortness of breath.    [provider]  albuterol (PROVENTIL HFA;VENTOLIN HFA) 108 (90 Base) MCG/ACT inhaler Inhale 2 puffs into the lungs every 4 (four) hours as needed for wheezing or shortness  of breath. 05/28/16   Pollina, Canary Brim, MD  aspirin EC 81 MG tablet Take 81 mg by mouth daily.    [provider]  benzonatate (TESSALON) 100 MG capsule Take 1 capsule (100 mg total) by mouth every 8 (eight) hours. 05/28/16   [name], MD  carvedilol (COREG) 12.5 MG tablet Take 18.75 mg by mouth 2 (two) times daily with a meal.    [provider]  digoxin (LANOXIN) 0.125 MG tablet Take 0.125 mg by mouth every Monday, Wednesday, and Friday.    [provider]  ferrous sulfate 325 (65 FE) MG tablet Take 325 mg by mouth daily with breakfast.    [provider]  furosemide (LASIX) 20 MG tablet Take 20 mg by mouth daily.    [provider]  lamoTRIgine (LAMICTAL) 100 MG tablet Take 100 mg by mouth daily.    [provider]  lisinopril (PRINIVIL,ZESTRIL) 2.5 MG tablet Take 2.5 mg by mouth daily.    [provider]  Multiple Vitamin (MULTIVITAMIN WITH MINERALS) TABS tablet Take 1 tablet by mouth daily.    [provider]  predniSONE (DELTASONE) 20 MG tablet Take 2 tablets (40 mg total) by mouth daily with breakfast. 05/28/16   Pollina, Canary Brim, MD  sertraline (ZOLOFT) 100 MG tablet Take 100 mg by mouth daily.    [provider]    Family History  History reviewed. No pertinent family history.  Social History Social History  Substance Use Topics  . Smoking status: Current Some Day Smoker    Packs/day: 0.50    Types: Cigarettes  . Smokeless tobacco: Never Used  . Alcohol use No     Allergies   Contrast media [iodinated diagnostic agents] and Ceclor [cefaclor]   Review of Systems Review of Systems  Constitutional: Negative for chills, diaphoresis and fever.  Respiratory: Positive for cough.   Cardiovascular: Positive for chest pain and palpitations.  Gastrointestinal: Positive for nausea. Negative for vomiting.  All other systems reviewed and are negative.  Physical Exam Updated Vital  Signs BP 101/68   Pulse 92   Resp 18   Ht 5' (1.524 m)   Wt 54.4 kg (120 lb)   LMP 06/23/2016   SpO2 97%   BMI 23.44 kg/m   Physical Exam  Constitutional: She is oriented to person, place, and time. She appears well-developed and well-nourished.  HENT:  Head: Normocephalic and atraumatic.  Eyes: EOM are normal. Pupils are equal, round, and reactive to light.  Neck: Normal range of motion. Neck supple. No JVD present.  Cardiovascular: Normal rate and regular rhythm.   Murmur heard.  Systolic murmur is present  1 to 2/6 systolic injection murmur at lower left sternal border   Pulmonary/Chest: Effort normal. She has wheezes. She has rhonchi. She has no rales. She exhibits no tenderness.  Coarse expiratory wheezes and rhonchi diffusely. Tender anterior chest wall.   Abdominal: Soft. Bowel sounds are normal. She exhibits no distension and no mass. There is no tenderness.  Musculoskeletal: Normal range of motion. She exhibits no edema.  Lymphadenopathy:    She has no cervical adenopathy.  Neurological: She is alert and oriented to person, place, and time. No cranial nerve deficit. She exhibits normal muscle tone. Coordination normal.  Skin: Skin is warm and dry. No rash noted.  Psychiatric: She has a normal mood and affect. Her behavior is normal. Judgment and thought content normal.  Nursing note and vitals reviewed.  ED Treatments / Results  DIAGNOSTIC STUDIES:  Oxygen Saturation is 97% on Room air, normal by my interpretation.    COORDINATION OF CARE:  3:40 AM Discussed treatment plan with pt at bedside and pt agreed to plan.   Labs (all labs ordered are listed, but only abnormal results are displayed) Labs Reviewed  BASIC METABOLIC PANEL - Abnormal; Notable for the following:       Result Value   Calcium 8.7 (*)    All other components within normal limits  CBC  DIFFERENTIAL  I-STAT TROPOININ, ED  I-STAT BETA HCG BLOOD, ED ([facility], [facility], [facility] ONLY)    EKG  EKG  Interpretation  Date/Time:  Saturday July 05 2016 02:55:32 EDT Ventricular Rate:  97 PR Interval:    QRS Duration: 99 QT Interval:  379 QTC Calculation: 482 R Axis:   110 Text Interpretation:  Sinus rhythm Borderline right axis deviation Borderline T abnormalities, diffuse leads Borderline prolonged QT interval Low voltage QRS When compared with ECG of 05/27/2016, No significant change was found Confirmed by Dione BoozeGlick, Crista Nuon ([id number]) on 07/05/2016 3:19:00 AM       Radiology Dg Chest 2 View  Result Date: 07/05/2016 CLINICAL DATA:  26 y/o  F; chest pain and shortness of breath. EXAM: CHEST  2 VIEW COMPARISON:  05/27/2016 chest radiograph FINDINGS: Normal cardiac silhouette. Post sternotomy with wires in alignment. Right-sided pacemaker. Clear lungs. No pleural effusion or pneumothorax. Bones are  unremarkable. IMPRESSION: No acute pulmonary process identified. Electronically Signed   By: Mitzi Hansen M.D.   On: 07/05/2016 03:21    Procedures Procedures (including critical care time)  Medications Ordered in ED Medications  ipratropium-albuterol (DUONEB) 0.5-2.5 (3) MG/3ML nebulizer solution 3 mL (3 mLs Nebulization Given 07/05/16 0413)  ketorolac (TORADOL) 30 MG/ML injection 30 mg (30 mg Intravenous Given 07/05/16 0355)  ipratropium-albuterol (DUONEB) 0.5-2.5 (3) MG/3ML nebulizer solution 3 mL (3 mLs Nebulization Given 07/05/16 0527)  methylPREDNISolone sodium succinate (SOLU-MEDROL) 125 mg/2 mL injection 125 mg (125 mg Intravenous Given 07/05/16 0505)     Initial Impression / Assessment and Plan / ED Course  I have reviewed the triage vital signs and the nursing notes.  Pertinent labs & imaging results that were available during my care of the patient were reviewed by me and considered in my medical decision making (see chart for details).  Cough with bronchospasm and chest wall pain. Old records are reviewed, and she does have prior ED visits for chest wall pain. She is followed  at [facility] for systolic heart failure. 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 She is not anticoagulated. Chest x-rays obtained and shows no evidence of pneumonia. She is given 2 nebulizer treatments with albuterol and ipratropium with progressive improvement in lung exam. There is minimal rhonchi and wheezes at this point. She was feeling much better and it was felt that she was safe to go home. She was given dose of methylprednisolone and is discharged with prescriptions for prednisone and albuterol inhaler. Return precautions discussed.  Final Clinical Impressions(s) / ED Diagnoses   Final diagnoses:  Acute bronchitis, unspecified organism  Bronchospasm  Chest wall pain    New Prescriptions New Prescriptions   ALBUTEROL (PROVENTIL HFA;VENTOLIN HFA) 108 (90 BASE) MCG/ACT INHALER    Inhale 2 puffs into the lungs every 4 (four) hours as needed for wheezing or shortness of breath (or coughing).   PREDNISONE (DELTASONE) 50 MG TABLET    Take 1 tablet (50 mg total) by mouth daily.   I personally performed the services described in this documentation, which was scribed in my presence. The recorded information has been reviewed and is accurate.       Dione Booze, MD 07/05/16 [phone]

## 2016-07-05 NOTE — ED Notes (Signed)
Shanda BumpsJessica contacted for resp tx

## 2016-07-27 ENCOUNTER — Encounter (HOSPITAL_COMMUNITY): Payer: Self-pay | Admitting: Emergency Medicine

## 2016-07-27 ENCOUNTER — Emergency Department (HOSPITAL_COMMUNITY): Payer: 59

## 2016-07-27 DIAGNOSIS — Z7982 Long term (current) use of aspirin: Secondary | ICD-10-CM | POA: Diagnosis not present

## 2016-07-27 DIAGNOSIS — R0789 Other chest pain: Secondary | ICD-10-CM | POA: Diagnosis not present

## 2016-07-27 DIAGNOSIS — F17210 Nicotine dependence, cigarettes, uncomplicated: Secondary | ICD-10-CM | POA: Insufficient documentation

## 2016-07-27 DIAGNOSIS — I509 Heart failure, unspecified: Secondary | ICD-10-CM | POA: Insufficient documentation

## 2016-07-27 DIAGNOSIS — R079 Chest pain, unspecified: Secondary | ICD-10-CM | POA: Diagnosis present

## 2016-07-27 DIAGNOSIS — Z79899 Other long term (current) drug therapy: Secondary | ICD-10-CM | POA: Diagnosis not present

## 2016-07-27 LAB — CBC
HCT: 38.8 % (ref 36.0–46.0)
HEMOGLOBIN: 12.9 g/dL (ref 12.0–15.0)
MCH: 29.9 pg (ref 26.0–34.0)
MCHC: 33.2 g/dL (ref 30.0–36.0)
MCV: 89.8 fL (ref 78.0–100.0)
PLATELETS: 251 10*3/uL (ref 150–400)
RBC: 4.32 MIL/uL (ref 3.87–5.11)
RDW: 13.7 % (ref 11.5–15.5)
WBC: 8.2 10*3/uL (ref 4.0–10.5)

## 2016-07-27 LAB — BASIC METABOLIC PANEL
Anion gap: 11 (ref 5–15)
BUN: 13 mg/dL (ref 6–20)
CALCIUM: 9.2 mg/dL (ref 8.9–10.3)
CO2: 18 mmol/L — AB (ref 22–32)
CREATININE: 0.71 mg/dL (ref 0.44–1.00)
Chloride: 108 mmol/L (ref 101–111)
GFR calc Af Amer: >60 mL/min (ref >60)
GLUCOSE: 115 mg/dL — AB (ref 65–99)
Potassium: 4.7 mmol/L (ref 3.5–5.1)
Sodium: 137 mmol/L (ref 135–145)

## 2016-07-27 LAB — I-STAT TROPONIN, ED: TROPONIN I, POC: 0 ng/mL (ref 0.00–0.08)

## 2016-07-27 NOTE — ED Triage Notes (Signed)
Pt presents to ED for assessment of left sided chest pain, shortness of breath, dizziness, and malaise since earlier this evening. Hx of open heart surgery with valve replacement, and heart failure.  Pt c/o increased SOB when laying down.  Pt c/o back pain, nausea.

## 2016-07-28 ENCOUNTER — Encounter (HOSPITAL_COMMUNITY): Payer: Self-pay | Admitting: Emergency Medicine

## 2016-07-28 ENCOUNTER — Emergency Department (HOSPITAL_COMMUNITY)
Admission: EM | Admit: 2016-07-28 | Discharge: 2016-07-28 | Disposition: A | Discharge status: Home / Self Care | Payer: 59 | Attending: Emergency Medicine | Admitting: Emergency Medicine

## 2016-07-28 DIAGNOSIS — R0789 Other chest pain: Secondary | ICD-10-CM

## 2016-07-28 HISTORY — DX: Heart failure, unspecified: I50.9

## 2016-07-28 LAB — I-STAT TROPONIN, ED: Troponin i, poc: 0 ng/mL (ref 0.00–0.08)

## 2016-07-28 LAB — I-STAT BETA HCG BLOOD, ED (MC, WL, AP ONLY): I-stat hCG, quantitative: <5 m[IU]/mL (ref <5)

## 2016-07-28 LAB — D-DIMER, QUANTITATIVE (NOT AT ARMC): D DIMER QUANT: 0.35 ug{FEU}/mL (ref 0.00–0.50)

## 2016-07-28 MED ORDER — KETOROLAC TROMETHAMINE 30 MG/ML IJ SOLN
30.0000 mg | Freq: Once | INTRAMUSCULAR | Status: AC
  Administered 2016-07-28: 30 mg via INTRAVENOUS
  Filled 2016-07-28: qty 1

## 2016-07-28 MED ORDER — PANTOPRAZOLE SODIUM 40 MG IV SOLR: 40.0000 mg | Freq: Once | INTRAVENOUS | Status: DC

## 2016-07-28 MED ORDER — MORPHINE SULFATE (PF) 4 MG/ML IV SOLN: 4.0000 mg | Freq: Once | INTRAVENOUS | Status: DC

## 2016-07-28 MED ORDER — ONDANSETRON HCL 4 MG/2ML IJ SOLN: 4.0000 mg | Freq: Once | INTRAMUSCULAR | Status: DC

## 2016-07-28 MED ORDER — SODIUM CHLORIDE 0.9 % IV BOLUS (SEPSIS): 1000.0000 mL | Freq: Once | INTRAVENOUS | Status: DC

## 2016-07-28 NOTE — ED Notes (Signed)
ED Provider at bedside. 

## 2016-07-28 NOTE — ED Provider Notes (Signed)
TIME SEEN: 1:29 AM  By signing my name below, I, [name], attest that this documentation has been prepared under the direction and in the presence of Georganna Maxson, Layla Maw, DO. Electronically Signed: Karren Cobble, ED Scribe. 07/28/16. 1:29 AM.  CHIEF COMPLAINT: Chest Pain   HPI:  Courtney Dawson is a 26 y.o. 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 She notes associated shortness of breath, nausea, lightheadedness, and a mild cough with no production. Pt reports having shortness of breath that worsens while lying down. She describes it as clenching and when it subsides there is tingling that radiates into her back. She is followed by the [facility] Failure [facility] at [facility]. She is currently on Asprin. Denies diaphoresis or leg swelling or calf pain.   ROS: See HPI Constitutional: no fever or diaphoresis Eyes: no drainage  ENT: no runny nose   Cardiovascular:  + chest pain  Resp: + SOB , + cough GI: no vomiting, + nausea GU: no dysuria Integumentary: no rash  Allergy: no hives  Musculoskeletal: no leg swelling  Neurological: no slurred speech, + dizziness, + lightheadedness ROS otherwise negative  PAST MEDICAL HISTORY/PAST SURGICAL HISTORY:  Past Medical History:  Diagnosis Date  . CHF (congestive heart failure) (HCC)   . Heart failure (HCC)     MEDICATIONS:  Prior to Admission medications   Medication Sig Start Date End Date Taking? Authorizing Provider  albuterol (PROVENTIL HFA;VENTOLIN HFA) 108 (90 Base) MCG/ACT inhaler Inhale 2 puffs into the lungs every 4 (four) hours as needed for wheezing or shortness of breath (or coughing). 07/05/16   Dione Booze, MD  aspirin EC 81 MG tablet Take 81 mg  by mouth daily.    [provider]  carvedilol (COREG) 12.5 MG tablet Take 18.75 mg by mouth 2 (two) times daily with a meal.    [provider]  digoxin (LANOXIN) 0.125 MG tablet Take 0.125 mg by mouth every Monday, Wednesday, and Friday.    [provider]  ferrous sulfate 325 (65 FE) MG tablet Take 325 mg by mouth daily with breakfast.    [provider]  furosemide (LASIX) 20 MG tablet Take 20 mg by mouth daily.    [provider]  lamoTRIgine (LAMICTAL) 100 MG tablet Take 100 mg by mouth daily.    [provider]  lisinopril (PRINIVIL,ZESTRIL) 2.5 MG tablet Take 2.5 mg by mouth daily.    [provider]  Multiple Vitamin (MULTIVITAMIN WITH MINERALS) TABS tablet Take 1 tablet by mouth daily.    [provider]  predniSONE (DELTASONE) 50 MG tablet Take 1 tablet (50 mg total) by mouth daily. 07/05/16   Dione Booze, MD  sertraline (ZOLOFT) 100 MG tablet Take 100 mg by mouth daily.    [provider]    ALLERGIES:  Allergies  Allergen Reactions  . Contrast Media [Iodinated Diagnostic Agents] Nausea And Vomiting  . Ceclor [Cefaclor] Rash    SOCIAL HISTORY:  Social History  Substance Use Topics  . Smoking status: Current Some Day Smoker    Packs/day: 0.50    Types: Cigarettes  . Smokeless tobacco: Never Used  . Alcohol use No    FAMILY HISTORY: History reviewed. No pertinent family history.  EXAM: BP 106/60  Pulse 77   Temp 98.6 F (37 C) (Oral)   Resp 15   Ht 5' (1.524 m)   Wt 120 lb (54.4 kg)   SpO2 99%   BMI 23.44 kg/m  CONSTITUTIONAL: Alert and oriented and responds appropriately to questions. Well-appearing; well-nourished HEAD: Normocephalic EYES: Conjunctivae clear, pupils appear equal, EOMI ENT: normal nose; moist mucous membranes NECK: Supple, no meningismus, no nuchal rigidity, no LAD  CARD: RRR; S1 and S2 appreciated; no murmurs, no clicks, no rubs, no gallops CHEST:  Chest wall  is mildly tender to palpation.  No crepitus, ecchymosis, erythema, warmth, rash or other lesions present.   RESP: Normal chest excursion without splinting or tachypnea; breath sounds clear and equal bilaterally; no wheezes, no rhonchi, no rales, no hypoxia or respiratory distress, speaking full sentences ABD/GI: Normal bowel sounds; non-distended; soft, non-tender, no rebound, no guarding, no peritoneal signs, no hepatosplenomegaly BACK:  The back appears normal and is non-tender to palpation, there is no CVA tenderness EXT: Normal ROM in all joints; non-tender to palpation; no edema; normal capillary refill; no cyanosis, no calf tenderness or swelling    SKIN: Normal color for age and race; warm; no rash NEURO: Moves all extremities equally PSYCH: The patient's mood and manner are appropriate. Grooming and personal hygiene are appropriate.  MEDICAL DECISION MAKING: Patient here with complaints of chest pain. Has a significant history of endocarditis with 2 valves that needed replacement leading to heart block status post pacemaker and then heart failure. She does not appear volume overloaded today and labs started in triage are unremarkable including negative troponin. She does have a slightly low bicarbonate but has a normal anion gap. Glucose is also normal. Chest x-ray shows mild cardiomegaly but no infiltrate or edema. She is very well-appearing here. Denies any palpitations, near syncopal events. EKG shows no ischemic abnormality. She is not currently being paced. She has follow-up scheduled with her cardiologist as an outpatient. We'll continue to monitor her on the cardiac monitor and obtain a second troponin, d-dimer. We'll give Toradol for discomfort. She has been sober for the past 9 months.  ED PROGRESS: Patient's second troponin and d-dimer negative. Pregnancy test negative. Pain almost completely gone after Toradol. She remains hemodynamically stable and a normal sinus rhythm without any  abnormal cardiac events. I feel she is safe to be discharged home. Doubt ACS, CHF exacerbation, pneumonia, dissection. Patient is comfortable with this plan. Discussed return precautions. She will follow-up with her outpatient cardiologist. Pain today seems very atypical, possibly musculoskeletal.  At this time, I do not feel there is any life-threatening condition present. I have reviewed and discussed all results (EKG, imaging, lab, urine as appropriate) and exam findings with patient/family. I have reviewed nursing notes and appropriate previous records.  I feel the patient is safe to be discharged home without further emergent workup and can continue workup as an outpatient as needed. Discussed usual and customary return precautions. Patient/family verbalize understanding and are comfortable with this plan.  Outpatient follow-up has been provided if needed. All questions have been answered.   EKG Interpretation  Date/Time:  Sunday July 27 2016 23:07:19 EDT Ventricular Rate:  98 PR Interval:  192 QRS Duration: 86 QT Interval:  348 QTC Calculation: 444 R Axis:   106 Text Interpretation:  Normal sinus rhythm Possible Left atrial enlargement Rightward axis Nonspecific T wave abnormality Abnormal ECG No significant change since last tracing Confirmed by Clemmie Marxen, Baxter Hire [phone]) on 07/28/2016 1:25:05 AM  I personally performed the services described in this documentation, which was scribed in my presence. The recorded information has been reviewed and is accurate.       Salah Nakamura, Layla MawKristen N, DO 07/28/16 [phone]

## 2016-07-28 NOTE — Discharge Instructions (Signed)
Your blood work today was normal including 2 negative sets of cardiac labs and a negative d-dimer which rules out blood clots. Your chest x-ray was clear and did not show any pulmonary edema or pneumonia. Please follow-up with your primary care provider and cardiologist closely.

## 2016-08-10 ENCOUNTER — Emergency Department (HOSPITAL_COMMUNITY): Payer: 59

## 2016-08-10 ENCOUNTER — Emergency Department (HOSPITAL_COMMUNITY)
Admission: EM | Admit: 2016-08-10 | Discharge: 2016-08-10 | Disposition: A | Discharge status: Home / Self Care | Payer: 59 | Attending: Emergency Medicine | Admitting: Emergency Medicine

## 2016-08-10 ENCOUNTER — Encounter (HOSPITAL_COMMUNITY): Payer: Self-pay

## 2016-08-10 DIAGNOSIS — I509 Heart failure, unspecified: Secondary | ICD-10-CM | POA: Diagnosis not present

## 2016-08-10 DIAGNOSIS — Z7982 Long term (current) use of aspirin: Secondary | ICD-10-CM | POA: Diagnosis not present

## 2016-08-10 DIAGNOSIS — F17210 Nicotine dependence, cigarettes, uncomplicated: Secondary | ICD-10-CM | POA: Diagnosis not present

## 2016-08-10 DIAGNOSIS — F419 Anxiety disorder, unspecified: Secondary | ICD-10-CM | POA: Insufficient documentation

## 2016-08-10 DIAGNOSIS — F410 Panic disorder [episodic paroxysmal anxiety] without agoraphobia: Secondary | ICD-10-CM | POA: Insufficient documentation

## 2016-08-10 DIAGNOSIS — R079 Chest pain, unspecified: Secondary | ICD-10-CM | POA: Diagnosis present

## 2016-08-10 DIAGNOSIS — Z79899 Other long term (current) drug therapy: Secondary | ICD-10-CM | POA: Insufficient documentation

## 2016-08-10 DIAGNOSIS — Z950 Presence of cardiac pacemaker: Secondary | ICD-10-CM | POA: Insufficient documentation

## 2016-08-10 LAB — CBC
HCT: 43.2 % (ref 36.0–46.0)
HEMOGLOBIN: 14.5 g/dL (ref 12.0–15.0)
MCH: 29.9 pg (ref 26.0–34.0)
MCHC: 33.6 g/dL (ref 30.0–36.0)
MCV: 89.1 fL (ref 78.0–100.0)
PLATELETS: 223 10*3/uL (ref 150–400)
RBC: 4.85 MIL/uL (ref 3.87–5.11)
RDW: 13.5 % (ref 11.5–15.5)
WBC: 8.2 10*3/uL (ref 4.0–10.5)

## 2016-08-10 LAB — BASIC METABOLIC PANEL
Anion gap: 11 (ref 5–15)
BUN: 12 mg/dL (ref 6–20)
CALCIUM: 9.4 mg/dL (ref 8.9–10.3)
CHLORIDE: 105 mmol/L (ref 101–111)
CO2: 20 mmol/L — AB (ref 22–32)
CREATININE: 0.81 mg/dL (ref 0.44–1.00)
GFR calc non Af Amer: >60 mL/min (ref >60)
GLUCOSE: 111 mg/dL — AB (ref 65–99)
Potassium: 3.5 mmol/L (ref 3.5–5.1)
Sodium: 136 mmol/L (ref 135–145)

## 2016-08-10 LAB — I-STAT TROPONIN, ED: TROPONIN I, POC: 0 ng/mL (ref 0.00–0.08)

## 2016-08-10 LAB — BRAIN NATRIURETIC PEPTIDE: B Natriuretic Peptide: 32.6 pg/mL (ref 0.0–100.0)

## 2016-08-10 MED ORDER — HYDROXYZINE HCL 25 MG PO TABS: 25.0000 mg | ORAL_TABLET | Freq: Four times a day (QID) | ORAL | 0 refills | Status: AC | PRN

## 2016-08-10 MED ORDER — LORAZEPAM 1 MG PO TABS
1.0000 mg | ORAL_TABLET | Freq: Once | ORAL | Status: AC
  Administered 2016-08-10: 1 mg via ORAL
  Filled 2016-08-10: qty 1

## 2016-08-10 NOTE — ED Notes (Signed)
Patient getting dressed and waiting on paper work

## 2016-08-10 NOTE — ED Notes (Signed)
Pt reports she has not been strictly compliant with her medications recently. Pt requesting that information not be shared in front of pt visitor. This RN verbalized understanding and will notify next shift RN.

## 2016-08-10 NOTE — ED Provider Notes (Signed)
[facility]-EMERGENCY DEPT Provider Note   CSN: [id number] Arrival date & time: 08/10/16  0051     History   Chief Complaint Chief Complaint  Patient presents with  . Chest Pain  . Anxiety    HPI Courtney Dawson is a 26 y.o. female.  Patient presents to the ER for evaluation of going around her mouth, tingling of the hands and feet, increased heart rate, chest pain and severe anxiety. Symptoms began suddenly prior to arrival in the ER. Patient is concerned because she has a history of valvular replacement and congestive heart failure. She does not have any known coronary artery disease.      Past Medical History:  Diagnosis Date  . CHF (congestive heart failure) (HCC)   . Heart failure (HCC)     There are no active problems to display for this patient.   Past Surgical History:  Procedure Laterality Date  . PACEMAKER INSERTION    . VALVE REPLACEMENT      OB History    Gravida Para Term Preterm AB Living   1             SAB TAB Ectopic Multiple Live Births                   Home Medications    Prior to Admission medications   Medication Sig Start Date End Date Taking? Authorizing Provider  albuterol (PROVENTIL HFA;VENTOLIN HFA) 108 (90 Base) MCG/ACT inhaler Inhale 2 puffs into the lungs every 4 (four) hours as needed for wheezing or shortness of breath (or coughing). 07/05/16  Yes Dione BoozeGlick, David, MD  aspirin EC 81 MG tablet Take 81 mg by mouth daily.   Yes [provider]  carvedilol (COREG) 12.5 MG tablet Take 18.75 mg by mouth 2 (two) times daily with a meal.   Yes [provider]  digoxin (LANOXIN) 0.125 MG tablet Take 0.125 mg by mouth every Monday, Wednesday, and Friday.   Yes [provider]  Etonogestrel (IMPLANON Trainer) Inject 1 application into the skin once.   Yes [provider]  ferrous sulfate 325 (65 FE) MG tablet Take 325 mg by mouth daily with breakfast.   Yes [provider]  furosemide (LASIX) 20 MG tablet Take 20  mg by mouth daily.   Yes [provider]  lamoTRIgine (LAMICTAL) 100 MG tablet Take 100 mg by mouth daily.   Yes [provider]  lisinopril (PRINIVIL,ZESTRIL) 2.5 MG tablet Take 2.5 mg by mouth daily.   Yes [provider]  Multiple Vitamin (MULTIVITAMIN WITH MINERALS) TABS tablet Take 1 tablet by mouth daily.   Yes [provider]  predniSONE (DELTASONE) 50 MG tablet Take 1 tablet (50 mg total) by mouth daily. 07/05/16  Yes Dione BoozeGlick, David, MD  sertraline (ZOLOFT) 100 MG tablet Take 100 mg by mouth daily.   Yes [provider]  hydrOXYzine (ATARAX/VISTARIL) 25 MG tablet Take 1-2 tablets (25-50 mg total) by mouth every 6 (six) hours as needed for anxiety. 08/10/16   Gilda CreasePollina, June Rode J, MD    Family History No family history on file.  Social History Social History  Substance Use Topics  . Smoking status: Current Some Day Smoker    Packs/day: 0.50    Types: Cigarettes  . Smokeless tobacco: Never Used  . Alcohol use No     Allergies   Contrast media [iodinated diagnostic agents] and Ceclor [cefaclor]   Review of Systems Review of Systems  Respiratory: Positive for shortness of breath.  Cardiovascular: Positive for chest pain.  Neurological: Positive for numbness.  Psychiatric/Behavioral: The patient is nervous/anxious.   All other systems reviewed and are negative.    Physical Exam Updated Vital Signs BP 121/90   Pulse (!) 120   Resp 17   Ht 5' (1.524 m)   Wt 54.4 kg (120 lb)   SpO2 100%   BMI 23.44 kg/m   Physical Exam  Constitutional: She is oriented to person, place, and time. She appears well-developed and well-nourished. No distress.  HENT:  Head: Normocephalic and atraumatic.  Right Ear: Hearing normal.  Left Ear: Hearing normal.  Nose: Nose normal.  Mouth/Throat: Oropharynx is clear and moist and mucous membranes are normal.  Eyes: Pupils are equal, round, and reactive to light. Conjunctivae and EOM are normal.    Neck: Normal range of motion. Neck supple.  Cardiovascular: Regular rhythm, S1 normal and S2 normal.  Tachycardia present.  Exam reveals no gallop and no friction rub.   No murmur heard. Pulmonary/Chest: Effort normal and breath sounds normal. No respiratory distress. She exhibits no tenderness.  Abdominal: Soft. Normal appearance and bowel sounds are normal. There is no hepatosplenomegaly. There is no tenderness. There is no rebound, no guarding, no tenderness at McBurney's point and negative Murphy's sign. No hernia.  Musculoskeletal: Normal range of motion.  Neurological: She is alert and oriented to person, place, and time. She has normal strength. No cranial nerve deficit or sensory deficit. Coordination normal. GCS eye subscore is 4. GCS verbal subscore is 5. GCS motor subscore is 6.  Skin: Skin is warm, dry and intact. No rash noted. No cyanosis.  Psychiatric: Her speech is normal and behavior is normal. Thought content normal. Her mood appears anxious.  Nursing note and vitals reviewed.    ED Treatments / Results  Labs (all labs ordered are listed, but only abnormal results are displayed) Labs Reviewed  BASIC METABOLIC PANEL - Abnormal; Notable for the following:       Result Value   CO2 20 (*)    Glucose, Bld 111 (*)    All other components within normal limits  CBC  BRAIN NATRIURETIC PEPTIDE  I-STAT TROPONIN, ED    EKG  EKG Interpretation  Date/Time:  Sunday August 10 2016 01:05:53 EDT Ventricular Rate:  115 PR Interval:    QRS Duration: 98 QT Interval:  329 QTC Calculation: 455 R Axis:   115 Text Interpretation:  Right and left arm electrode reversal, interpretation assumes no reversal Sinus tachycardia Probable left atrial enlargement Right axis deviation Borderline repolarization abnormality Baseline wander in lead(s) V5 V6 No significant change since last tracing Confirmed by [name] [phone]) on 08/10/2016 1:45:43 AM       Radiology Dg Chest 2  View  Result Date: 08/10/2016 CLINICAL DATA:  26 year old female with history of CHF presenting with chest pain. EXAM: CHEST  2 VIEW COMPARISON:  Chest radiograph dated 07/27/2016 FINDINGS: Minimal left lung base atelectatic changes. No focal consolidation, pleural effusion, or pneumothorax. Top-normal cardiac size. Median sternotomy wires and right-sided pacemaker device. No acute osseous pathology. IMPRESSION: 1. No acute cardiopulmonary process. 2. Borderline cardiomegaly. Electronically Signed   By: Elgie Collard M.D.   On: 08/10/2016 02:44    Procedures Procedures (including critical care time)  Medications Ordered in ED Medications  LORazepam (ATIVAN) tablet 1 mg (1 mg Oral Given 08/10/16 0248)     Initial Impression / Assessment and Plan / ED Course  I have reviewed the triage vital signs and  the nursing notes.  Pertinent labs & imaging results that were available during my care of the patient were reviewed by me and considered in my medical decision making (see chart for details).     Patient has previous history of heart valve replacement secondary to endocarditis from IV drug abuse. She is concerned because of her history of congestive heart failure, but she appears to have a competent valve function at this time. Examination does not raise concern for congestive heart failure. Chest x-ray is clear, no pulmonary edema. BNP is 35. Patient treated with Ativan and has had significant improvement in her symptoms. Patient is consistent with panic attack with hyperventilation syndrome.  Final Clinical Impressions(s) / ED Diagnoses   Final diagnoses:  Panic attack    New Prescriptions New Prescriptions   HYDROXYZINE (ATARAX/VISTARIL) 25 MG TABLET    Take 1-2 tablets (25-50 mg total) by mouth every 6 (six) hours as needed for anxiety.     Gilda CreasePollina, Tiondra Fang J, MD 08/10/16 [phone]

## 2016-08-10 NOTE — ED Triage Notes (Signed)
Pt with hx of CHF from work via EMS with chest pain and anxiety. Per EMS, pt working when she began feeling L sided chest pressure radiating to her L shoulder and back. Pt tachypneic with HR 140s on arrival with reports of weakness, nausea, and tingling in hands. Pt HR decreased to 110-119 bpm with therapeutic communication and vagal maneuvers. Per EMS, 12-lead unremarkable. EMS VS 118 bpm, 100% on RA, 123/89. A&Ox4.

## 2016-09-03 ENCOUNTER — Emergency Department (HOSPITAL_COMMUNITY): Payer: 59

## 2016-09-03 ENCOUNTER — Encounter (HOSPITAL_COMMUNITY): Payer: Self-pay | Admitting: Emergency Medicine

## 2016-09-03 ENCOUNTER — Observation Stay (HOSPITAL_COMMUNITY)
Admission: EM | Admit: 2016-09-03 | Discharge: 2016-09-04 | Disposition: A | Discharge status: Home / Self Care | Payer: 59 | Attending: Student in an Organized Health Care Education/Training Program | Admitting: Student in an Organized Health Care Education/Training Program

## 2016-09-03 DIAGNOSIS — Z91041 Radiographic dye allergy status: Secondary | ICD-10-CM | POA: Diagnosis not present

## 2016-09-03 DIAGNOSIS — Z833 Family history of diabetes mellitus: Secondary | ICD-10-CM

## 2016-09-03 DIAGNOSIS — F1111 Opioid abuse, in remission: Secondary | ICD-10-CM | POA: Insufficient documentation

## 2016-09-03 DIAGNOSIS — F17210 Nicotine dependence, cigarettes, uncomplicated: Secondary | ICD-10-CM

## 2016-09-03 DIAGNOSIS — Z950 Presence of cardiac pacemaker: Secondary | ICD-10-CM | POA: Diagnosis present

## 2016-09-03 DIAGNOSIS — F1911 Other psychoactive substance abuse, in remission: Secondary | ICD-10-CM

## 2016-09-03 DIAGNOSIS — Z8679 Personal history of other diseases of the circulatory system: Secondary | ICD-10-CM | POA: Diagnosis not present

## 2016-09-03 DIAGNOSIS — T827XXA Infection and inflammatory reaction due to other cardiac and vascular devices, implants and grafts, initial encounter: Principal | ICD-10-CM | POA: Diagnosis present

## 2016-09-03 DIAGNOSIS — I5022 Chronic systolic (congestive) heart failure: Secondary | ICD-10-CM | POA: Insufficient documentation

## 2016-09-03 DIAGNOSIS — Z952 Presence of prosthetic heart valve: Secondary | ICD-10-CM

## 2016-09-03 DIAGNOSIS — Z953 Presence of xenogenic heart valve: Secondary | ICD-10-CM | POA: Insufficient documentation

## 2016-09-03 DIAGNOSIS — Z7982 Long term (current) use of aspirin: Secondary | ICD-10-CM | POA: Insufficient documentation

## 2016-09-03 DIAGNOSIS — Y838 Other surgical procedures as the cause of abnormal reaction of the patient, or of later complication, without mention of misadventure at the time of the procedure: Secondary | ICD-10-CM | POA: Diagnosis not present

## 2016-09-03 DIAGNOSIS — Z79899 Other long term (current) drug therapy: Secondary | ICD-10-CM | POA: Insufficient documentation

## 2016-09-03 DIAGNOSIS — Z975 Presence of (intrauterine) contraceptive device: Secondary | ICD-10-CM | POA: Insufficient documentation

## 2016-09-03 DIAGNOSIS — I5020 Unspecified systolic (congestive) heart failure: Secondary | ICD-10-CM | POA: Diagnosis present

## 2016-09-03 DIAGNOSIS — L0889 Other specified local infections of the skin and subcutaneous tissue: Secondary | ICD-10-CM | POA: Diagnosis not present

## 2016-09-03 DIAGNOSIS — T82110A Breakdown (mechanical) of cardiac electrode, initial encounter: Secondary | ICD-10-CM

## 2016-09-03 DIAGNOSIS — B1920 Unspecified viral hepatitis C without hepatic coma: Secondary | ICD-10-CM | POA: Diagnosis not present

## 2016-09-03 DIAGNOSIS — Y712 Prosthetic and other implants, materials and accessory cardiovascular devices associated with adverse incidents: Secondary | ICD-10-CM | POA: Diagnosis not present

## 2016-09-03 DIAGNOSIS — L03313 Cellulitis of chest wall: Secondary | ICD-10-CM | POA: Diagnosis present

## 2016-09-03 DIAGNOSIS — E876 Hypokalemia: Secondary | ICD-10-CM | POA: Diagnosis not present

## 2016-09-03 HISTORY — DX: Personal history of other specified conditions: Z87.898

## 2016-09-03 HISTORY — DX: Presence of cardiac pacemaker: Z95.0

## 2016-09-03 HISTORY — DX: Personal history of other diseases of the circulatory system: Z86.79

## 2016-09-03 HISTORY — DX: Presence of other heart-valve replacement: Z95.4

## 2016-09-03 HISTORY — DX: Presence of xenogenic heart valve: Z95.3

## 2016-09-03 LAB — URINALYSIS, ROUTINE W REFLEX MICROSCOPIC
Bilirubin Urine: NEGATIVE
GLUCOSE, UA: NEGATIVE mg/dL
Hgb urine dipstick: NEGATIVE
KETONES UR: NEGATIVE mg/dL
LEUKOCYTES UA: NEGATIVE
NITRITE: NEGATIVE
PROTEIN: NEGATIVE mg/dL
Specific Gravity, Urine: 1.023 (ref 1.005–1.030)
pH: 5 (ref 5.0–8.0)

## 2016-09-03 LAB — COMPREHENSIVE METABOLIC PANEL
ALT: 17 U/L (ref 14–54)
ANION GAP: 8 (ref 5–15)
AST: 20 U/L (ref 15–41)
Albumin: 3.8 g/dL (ref 3.5–5.0)
Alkaline Phosphatase: 54 U/L (ref 38–126)
BUN: 15 mg/dL (ref 6–20)
CHLORIDE: 109 mmol/L (ref 101–111)
CO2: 21 mmol/L — AB (ref 22–32)
Calcium: 8.7 mg/dL — ABNORMAL LOW (ref 8.9–10.3)
Creatinine, Ser: 0.82 mg/dL (ref 0.44–1.00)
Glucose, Bld: 109 mg/dL — ABNORMAL HIGH (ref 65–99)
Potassium: 3.1 mmol/L — ABNORMAL LOW (ref 3.5–5.1)
SODIUM: 138 mmol/L (ref 135–145)
Total Bilirubin: 1.3 mg/dL — ABNORMAL HIGH (ref 0.3–1.2)
Total Protein: 6.7 g/dL (ref 6.5–8.1)

## 2016-09-03 LAB — CBC
HEMATOCRIT: 38.4 % (ref 36.0–46.0)
HEMOGLOBIN: 12.3 g/dL (ref 12.0–15.0)
MCH: 29.1 pg (ref 26.0–34.0)
MCHC: 32 g/dL (ref 30.0–36.0)
MCV: 91 fL (ref 78.0–100.0)
PLATELETS: 216 10*3/uL (ref 150–400)
RBC: 4.22 MIL/uL (ref 3.87–5.11)
RDW: 13.9 % (ref 11.5–15.5)
WBC: 10.5 10*3/uL (ref 4.0–10.5)

## 2016-09-03 LAB — I-STAT TROPONIN, ED
TROPONIN I, POC: 0 ng/mL (ref 0.00–0.08)
TROPONIN I, POC: 0.01 ng/mL (ref 0.00–0.08)

## 2016-09-03 LAB — I-STAT CG4 LACTIC ACID, ED
LACTIC ACID, VENOUS: 1.14 mmol/L (ref 0.5–1.9)
Lactic Acid, Venous: 0.45 mmol/L — ABNORMAL LOW (ref 0.5–1.9)

## 2016-09-03 LAB — RAPID URINE DRUG SCREEN, HOSP PERFORMED
Amphetamines: POSITIVE — AB
BENZODIAZEPINES: NOT DETECTED
Barbiturates: NOT DETECTED
COCAINE: NOT DETECTED
Opiates: NOT DETECTED
Tetrahydrocannabinol: NOT DETECTED

## 2016-09-03 LAB — I-STAT BETA HCG BLOOD, ED (MC, WL, AP ONLY)

## 2016-09-03 LAB — SEDIMENTATION RATE: Sed Rate: 13 mm/hr (ref 0–22)

## 2016-09-03 LAB — DIGOXIN LEVEL

## 2016-09-03 LAB — BRAIN NATRIURETIC PEPTIDE: B NATRIURETIC PEPTIDE 5: 85.1 pg/mL (ref 0.0–100.0)

## 2016-09-03 MED ORDER — VANCOMYCIN HCL IN DEXTROSE 750-5 MG/150ML-% IV SOLN
750.0000 mg | Freq: Two times a day (BID) | INTRAVENOUS | Status: DC
  Administered 2016-09-04: 750 mg via INTRAVENOUS
  Filled 2016-09-03 (×2): qty 150

## 2016-09-03 MED ORDER — ASPIRIN EC 81 MG PO TBEC
81.0000 mg | DELAYED_RELEASE_TABLET | Freq: Every day | ORAL | Status: DC
  Administered 2016-09-03 – 2016-09-04 (×2): 81 mg via ORAL
  Filled 2016-09-03 (×2): qty 1

## 2016-09-03 MED ORDER — SERTRALINE HCL 100 MG PO TABS
100.0000 mg | ORAL_TABLET | Freq: Every day | ORAL | Status: DC
  Administered 2016-09-03 – 2016-09-04 (×2): 100 mg via ORAL
  Filled 2016-09-03 (×2): qty 1

## 2016-09-03 MED ORDER — CLINDAMYCIN PHOSPHATE 600 MG/50ML IV SOLN
600.0000 mg | Freq: Once | INTRAVENOUS | Status: AC
  Administered 2016-09-03: 600 mg via INTRAVENOUS
  Filled 2016-09-03: qty 50

## 2016-09-03 MED ORDER — DIGOXIN 125 MCG PO TABS
0.1250 mg | ORAL_TABLET | ORAL | Status: DC
  Administered 2016-09-03: 0.125 mg via ORAL
  Filled 2016-09-03: qty 1

## 2016-09-03 MED ORDER — VANCOMYCIN HCL IN DEXTROSE 1-5 GM/200ML-% IV SOLN
1000.0000 mg | Freq: Once | INTRAVENOUS | Status: AC
  Administered 2016-09-03: 1000 mg via INTRAVENOUS
  Filled 2016-09-03: qty 200

## 2016-09-03 MED ORDER — SODIUM CHLORIDE 0.9% FLUSH
3.0000 mL | Freq: Two times a day (BID) | INTRAVENOUS | Status: DC
  Administered 2016-09-03 – 2016-09-04 (×2): 3 mL via INTRAVENOUS

## 2016-09-03 MED ORDER — ENOXAPARIN SODIUM 40 MG/0.4ML ~~LOC~~ SOLN: 40.0000 mg | SUBCUTANEOUS | Status: DC

## 2016-09-03 MED ORDER — POTASSIUM CHLORIDE CRYS ER 20 MEQ PO TBCR
40.0000 meq | EXTENDED_RELEASE_TABLET | Freq: Once | ORAL | Status: AC
  Administered 2016-09-03: 40 meq via ORAL
  Filled 2016-09-03: qty 2

## 2016-09-03 MED ORDER — ACETAMINOPHEN 650 MG RE SUPP: 650.0000 mg | Freq: Four times a day (QID) | RECTAL | Status: DC | PRN

## 2016-09-03 MED ORDER — ACETAMINOPHEN 325 MG PO TABS
650.0000 mg | ORAL_TABLET | Freq: Four times a day (QID) | ORAL | Status: DC | PRN
  Administered 2016-09-03 (×2): 650 mg via ORAL
  Filled 2016-09-03 (×2): qty 2

## 2016-09-03 MED ORDER — SODIUM CHLORIDE 0.9 % IV BOLUS (SEPSIS)
1000.0000 mL | Freq: Once | INTRAVENOUS | Status: AC
  Administered 2016-09-03: 1000 mL via INTRAVENOUS

## 2016-09-03 MED ORDER — CARVEDILOL 25 MG PO TABS
25.0000 mg | ORAL_TABLET | Freq: Two times a day (BID) | ORAL | Status: DC
  Administered 2016-09-03 – 2016-09-04 (×2): 25 mg via ORAL
  Filled 2016-09-03: qty 1

## 2016-09-03 MED ORDER — KETOROLAC TROMETHAMINE 10 MG PO TABS: 10.0000 mg | ORAL_TABLET | Freq: Four times a day (QID) | ORAL | Status: DC

## 2016-09-03 MED ORDER — KETOROLAC TROMETHAMINE 10 MG PO TABS
10.0000 mg | ORAL_TABLET | Freq: Four times a day (QID) | ORAL | Status: AC | PRN
  Administered 2016-09-04: 10 mg via ORAL
  Filled 2016-09-03 (×2): qty 1

## 2016-09-03 MED ORDER — HYDROXYZINE HCL 25 MG PO TABS
25.0000 mg | ORAL_TABLET | Freq: Once | ORAL | Status: AC
  Administered 2016-09-03: 25 mg via ORAL
  Filled 2016-09-03: qty 1

## 2016-09-03 MED ORDER — SODIUM CHLORIDE 0.9 % IV SOLN
INTRAVENOUS | Status: AC
  Administered 2016-09-03: 15:00:00 via INTRAVENOUS

## 2016-09-03 MED ORDER — LISINOPRIL 2.5 MG PO TABS
2.5000 mg | ORAL_TABLET | Freq: Every day | ORAL | Status: DC
  Administered 2016-09-03 – 2016-09-04 (×2): 2.5 mg via ORAL
  Filled 2016-09-03 (×2): qty 1

## 2016-09-03 NOTE — Consult Note (Signed)
[address]       [place] [address]             [phone]      Cardiothoracic Surgery Consultation  Reason for Consult: Infected temporary epicardial pacing wire Referring Physician: Dr. Keturah Barre. Courtney Dawson is an 26 y.o. female.  HPI:   The patient is a 75 year old drug abuser with hepatitis C who underwent MVR and TVR using porcine valves in 02/2015 in [place] for endocarditis. She reportedly had a very complicated postop course with multisystem organ failure requiring a trach, PEG, CRRT. She subsequently had a permanent dual chamber pacer inserted. She denies any drug use since 09/2015. Her last echo on 05/10/2016 showed an EF of 35-40% with no vegetations. She presented today with a panic attack and reports having a bump come up on the lower left chest recently and then a wire protruded through the skin. She pulled up on the wire and cut it off. Since then she has developed some swelling and redness around the site. She denies any fever or chills.  Past Medical History:  Diagnosis Date  . CHF (congestive heart failure) ([place])   . Heart failure ([place])   . History of endocarditis   . History of substance use   . Pacemaker   . S/P mitral valve replacement with bioprosthetic valve   . S/P tricuspid valve replacement     Past Surgical History:  Procedure Laterality Date  . PACEMAKER INSERTION    . VALVE REPLACEMENT      Family History  Problem Relation Age of Onset  . Diabetes Maternal Grandfather     Social History:  reports that she has been smoking Cigarettes.  She has been smoking about 0.50 packs per day. She has never used smokeless tobacco. She reports that she uses drugs, including Cocaine, "Crack" cocaine, and Heroin. She reports that she does not drink alcohol.  Allergies:  Allergies  Allergen Reactions  . Contrast Media [Iodinated Diagnostic Agents] Nausea And Vomiting  . Ceclor [Cefaclor] Rash    Medications:  I have reviewed  the patient's current medications. Prior to Admission:  Prescriptions Prior to Admission  Medication Sig Dispense Refill Last Dose  . albuterol (PROVENTIL HFA;VENTOLIN HFA) 108 (90 Base) MCG/ACT inhaler Inhale 2 puffs into the lungs every 4 (four) hours as needed for wheezing or shortness of breath (or coughing). 1 Inhaler 0 Past Week at Unknown time  . aspirin EC 81 MG tablet Take 81 mg by mouth daily.   09/02/2016 at Unknown time  . carvedilol (COREG) 12.5 MG tablet Take 25 mg by mouth 2 (two) times daily with a meal.    09/02/2016 at 8pm  . dextromethorphan (DELSYM) 30 MG/5ML liquid Take 30 mg by mouth as needed for cough.   09/02/2016 at Unknown time  . digoxin (LANOXIN) 0.125 MG tablet Take 0.125 mg by mouth every Monday, Wednesday, and Friday.   Past Week at Unknown time  . Etonogestrel (IMPLANON [place]) Inject 1 application into the skin once.   09/03/2016 at Unknown time  . ferrous sulfate 325 (65 FE) MG tablet Take 325 mg by mouth daily with breakfast.   09/02/2016 at Unknown time  . furosemide (LASIX) 20 MG tablet Take 20 mg by mouth daily.   09/02/2016 at Unknown time  . hydrOXYzine (ATARAX/VISTARIL) 25 MG tablet Take 1-2 tablets (25-50 mg total) by mouth every 6 (six) hours as needed for anxiety. 15 tablet  0 09/02/2016 at Unknown time  . lamoTRIgine (LAMICTAL) 100 MG tablet Take 100 mg by mouth daily.   09/02/2016 at Unknown time  . lisinopril (PRINIVIL,ZESTRIL) 2.5 MG tablet Take 2.5 mg by mouth daily.   09/02/2016 at Unknown time  . sertraline (ZOLOFT) 100 MG tablet Take 100 mg by mouth daily.   09/02/2016 at Unknown time  . Multiple Vitamin (MULTIVITAMIN WITH MINERALS) TABS tablet Take 1 tablet by mouth daily.   Past Month at Unknown time  . predniSONE (DELTASONE) 50 MG tablet Take 1 tablet (50 mg total) by mouth daily. 5 tablet 0 Past Month at Unknown time   Scheduled: . aspirin EC  81 mg Oral Daily  . carvedilol  25 mg Oral BID WC  . digoxin  0.125 mg Oral Q M,W,F  . [START ON 09/04/2016]  enoxaparin (LOVENOX) injection  40 mg Subcutaneous Q24H  . lisinopril  2.5 mg Oral Daily  . sertraline  100 mg Oral Daily  . sodium chloride flush  3 mL Intravenous Q12H   Continuous: . sodium chloride 100 mL/hr at 09/03/16 1443  . [START ON 09/04/2016] vancomycin     EYC:[id number] **OR** acetaminophen  Results for orders placed or performed during the hospital encounter of 09/03/16 (from the past 48 hour(s))  CBC     Status: None   Collection Time: 09/03/16  6:42 AM  Result Value Ref Range   WBC 10.5 4.0 - 10.5 K/uL   RBC 4.22 3.87 - 5.11 MIL/uL   Hemoglobin 12.3 12.0 - 15.0 g/dL   HCT 38.4 36.0 - 46.0 %   MCV 91.0 78.0 - 100.0 fL   MCH 29.1 26.0 - 34.0 pg   MCHC 32.0 30.0 - 36.0 g/dL   RDW 13.9 11.5 - 15.5 %   Platelets 216 150 - 400 K/uL  Comprehensive metabolic panel     Status: Abnormal   Collection Time: 09/03/16  6:42 AM  Result Value Ref Range   Sodium 138 135 - 145 mmol/L   Potassium 3.1 (L) 3.5 - 5.1 mmol/L   Chloride 109 101 - 111 mmol/L   CO2 21 (L) 22 - 32 mmol/L   Glucose, Bld 109 (H) 65 - 99 mg/dL   BUN 15 6 - 20 mg/dL   Creatinine, Ser 0.82 0.44 - 1.00 mg/dL   Calcium 8.7 (L) 8.9 - 10.3 mg/dL   Total Protein 6.7 6.5 - 8.1 g/dL   Albumin 3.8 3.5 - 5.0 g/dL   AST 20 15 - 41 U/L   ALT 17 14 - 54 U/L   Alkaline Phosphatase 54 38 - 126 U/L   Total Bilirubin 1.3 (H) 0.3 - 1.2 mg/dL   GFR calc non Af Amer >60 >60 mL/min   GFR calc Af Amer >60 >60 mL/min    Comment: (NOTE) The eGFR has been calculated using the CKD EPI equation. This calculation has not been validated in all clinical situations. eGFR's persistently <60 mL/min signify possible Chronic Kidney Disease.    Anion gap 8 5 - 15  Digoxin level     Status: Abnormal   Collection Time: 09/03/16  6:42 AM  Result Value Ref Range   Digoxin Level <0.2 (L) 0.8 - 2.0 ng/mL    Comment: RESULTS CONFIRMED BY MANUAL DILUTION  Urinalysis, Routine w reflex microscopic     Status: Abnormal   Collection  Time: 09/03/16  6:42 AM  Result Value Ref Range   Color, Urine YELLOW YELLOW   APPearance HAZY (A) CLEAR  Specific Gravity, Urine 1.023 1.005 - 1.030   pH 5.0 5.0 - 8.0   Glucose, UA NEGATIVE NEGATIVE mg/dL   Hgb urine dipstick NEGATIVE NEGATIVE   Bilirubin Urine NEGATIVE NEGATIVE   Ketones, ur NEGATIVE NEGATIVE mg/dL   Protein, ur NEGATIVE NEGATIVE mg/dL   Nitrite NEGATIVE NEGATIVE   Leukocytes, UA NEGATIVE NEGATIVE  Urine rapid drug screen (hosp performed)     Status: Abnormal   Collection Time: 09/03/16  6:42 AM  Result Value Ref Range   Opiates NONE DETECTED NONE DETECTED   Cocaine NONE DETECTED NONE DETECTED   Benzodiazepines NONE DETECTED NONE DETECTED   Amphetamines POSITIVE (A) NONE DETECTED   Tetrahydrocannabinol NONE DETECTED NONE DETECTED   Barbiturates NONE DETECTED NONE DETECTED    Comment:        DRUG SCREEN FOR MEDICAL PURPOSES ONLY.  IF CONFIRMATION IS NEEDED FOR ANY PURPOSE, NOTIFY LAB WITHIN 5 DAYS.        LOWEST DETECTABLE LIMITS FOR URINE DRUG SCREEN Drug Class       Cutoff (ng/mL) Amphetamine      1000 Barbiturate      200 Benzodiazepine   607 Tricyclics       371 Opiates          300 Cocaine          300 THC              50   I-stat troponin, ED     Status: None   Collection Time: 09/03/16  6:56 AM  Result Value Ref Range   Troponin i, poc 0.00 0.00 - 0.08 ng/mL   Comment 3            Comment: Due to the release kinetics of cTnI, a negative result within the first hours of the onset of symptoms does not rule out myocardial infarction with certainty. If myocardial infarction is still suspected, repeat the test at appropriate intervals.   I-Stat beta hCG blood, ED     Status: None   Collection Time: 09/03/16  7:01 AM  Result Value Ref Range   I-stat hCG, quantitative <5.0 <5 mIU/mL   Comment 3            Comment:   GEST. AGE      CONC.  (mIU/mL)   <=1 WEEK        5 - 50     2 WEEKS       50 - 500     3 WEEKS       100 - 10,000     4  WEEKS     1,000 - 30,000        FEMALE AND NON-PREGNANT FEMALE:     LESS THAN 5 mIU/mL   Brain natriuretic peptide     Status: None   Collection Time: 09/03/16  8:15 AM  Result Value Ref Range   B Natriuretic Peptide 85.1 0.0 - 100.0 pg/mL  Sedimentation rate     Status: None   Collection Time: 09/03/16  8:15 AM  Result Value Ref Range   Sed Rate 13 0 - 22 mm/hr  I-stat troponin, ED     Status: None   Collection Time: 09/03/16  8:27 AM  Result Value Ref Range   Troponin i, poc 0.01 0.00 - 0.08 ng/mL   Comment 3            Comment: Due to the release kinetics of cTnI, a negative result within the first hours  of the onset of symptoms does not rule out myocardial infarction with certainty. If myocardial infarction is still suspected, repeat the test at appropriate intervals.   I-Stat CG4 Lactic Acid, ED     Status: None   Collection Time: 09/03/16  9:31 AM  Result Value Ref Range   Lactic Acid, Venous 1.14 0.5 - 1.9 mmol/L  I-Stat CG4 Lactic Acid, ED     Status: Abnormal   Collection Time: 09/03/16 10:53 AM  Result Value Ref Range   Lactic Acid, Venous 0.45 (L) 0.5 - 1.9 mmol/L    Dg Chest 2 View  Result Date: 09/03/2016 CLINICAL DATA:  Chest pain EXAM: CHEST  2 VIEW COMPARISON:  08/10/2016 FINDINGS: Right pacer remains in place, unchanged. Heart and mediastinal contours are within normal limits. No focal opacities or effusions. No acute bony abnormality. IMPRESSION: No active cardiopulmonary disease. Electronically Signed   By: Rolm Baptise M.D.   On: 09/03/2016 07:19   Ct Chest Wo Contrast  Result Date: 09/03/2016 CLINICAL DATA:  Dry cough and shortness of breath. EXAM: CT CHEST WITHOUT CONTRAST TECHNIQUE: Multidetector CT imaging of the chest was performed following the standard protocol without IV contrast. COMPARISON:  CT chest dated February 25, 2016. FINDINGS: Cardiovascular: Right chest wall pacer device is unchanged. Prior mitral and tricuspid valve replacements.  Cardiomegaly, unchanged. No pericardial effusion. Abandoned epicardial leads are noted. Normal caliber thoracic aorta. Mediastinum/Nodes: No enlarged mediastinal or axillary lymph nodes. Thyroid gland, trachea, and esophagus demonstrate no significant findings. Lungs/Pleura: Scattered areas of subsegmental atelectasis/scarring are unchanged. No suspicious pulmonary nodule. No consolidation. No pleural effusion or pneumothorax. Upper Abdomen: No acute abnormality. Musculoskeletal: Prior median sternotomy. No acute fracture or significant osseous finding. IMPRESSION: No acute intrathoracic process. No consolidation or pleural effusion. Electronically Signed   By: Titus Dubin M.D.   On: 09/03/2016 09:00    Review of Systems  Constitutional: Negative for chills, fever and malaise/fatigue.  HENT: Negative.   Eyes: Negative.   Respiratory: Positive for cough. Negative for sputum production and shortness of breath.   Cardiovascular: Negative for chest pain and leg swelling.  Gastrointestinal: Positive for diarrhea.  Genitourinary: Negative.   Musculoskeletal: Negative.   Skin:       Mild swelling and erythema around wire protrusion site.  Neurological: Negative.   Endo/Heme/Allergies: Negative.   Psychiatric/Behavioral: The patient is nervous/anxious.    Blood pressure (!) 103/59, pulse 68, temperature 97.9 F (36.6 C), temperature source Oral, resp. rate 20, height 5' (1.524 m), weight 54.2 kg (119 lb 6.4 oz), SpO2 100 %. Physical Exam  Constitutional: She is oriented to person, place, and time. She appears well-developed and well-nourished. No distress.  HENT:  Head: Normocephalic and atraumatic.  Eyes: Pupils are equal, round, and reactive to light. EOM are normal.  Neck: Normal range of motion. Neck supple. No JVD present.  Cardiovascular: Normal rate, regular rhythm, normal heart sounds and intact distal pulses.  Exam reveals no gallop and no friction rub.   No murmur  heard. Respiratory: Effort normal and breath sounds normal. No respiratory distress.  Some induration around the previous wire protrusion site with no drainage. No erythema, minimal tenderness. No fluctuance.  Sternotomy well-healed Sternum stable.  GI: Soft. Bowel sounds are normal. She exhibits no distension. There is no tenderness.  Musculoskeletal: Normal range of motion. She exhibits no edema.  Lymphadenopathy:    She has no cervical adenopathy.  Neurological: She is alert and oriented to person, place, and time.  Skin: Skin  is warm and dry.  Psychiatric: She has a normal mood and affect.   CLINICAL DATA:  Dry cough and shortness of breath.  EXAM: CT CHEST WITHOUT CONTRAST  TECHNIQUE: Multidetector CT imaging of the chest was performed following the standard protocol without IV contrast.  COMPARISON:  CT chest dated February 25, 2016.  FINDINGS: Cardiovascular: Right chest wall pacer device is unchanged. Prior mitral and tricuspid valve replacements. Cardiomegaly, unchanged. No pericardial effusion. Abandoned epicardial leads are noted. Normal caliber thoracic aorta.  Mediastinum/Nodes: No enlarged mediastinal or axillary lymph nodes. Thyroid gland, trachea, and esophagus demonstrate no significant findings.  Lungs/Pleura: Scattered areas of subsegmental atelectasis/scarring are unchanged. No suspicious pulmonary nodule. No consolidation. No pleural effusion or pneumothorax.  Upper Abdomen: No acute abnormality.  Musculoskeletal: Prior median sternotomy. No acute fracture or significant osseous finding.  IMPRESSION: No acute intrathoracic process. No consolidation or pleural effusion.   Electronically Signed   By: Titus Dubin M.D.   On: 09/03/2016 09:00  Assessment/Plan:  She has retained temporary pacing wires from her heart surgery and most likely one of these gradually migrated to the surface and protruded through the skin resulting in  the induration and swelling. She cut off the protruding wire so that now nothing is protruding. There is no erythema. The wire is visible in the subcutaneous tissue on the CT scan and could migrate out further and protrude again. I would continue the antibiotic for now to try to resolve this and if it flares up again or protrudes then it will require surgical exploration for removal. Some institutions do not completely remove the wires and just pull then up and snip them off and let them retract back in to avoid any risk of bleeding from the heart. I will check her again tomorrow and if it looks stable she could go home on an oral antibiotic and she can either follow up with me or her physician at [facility] 09/03/2016, 6:09 PM

## 2016-09-03 NOTE — ED Triage Notes (Signed)
Patient presents with rapid respirations , fingers tingling and facial numbness onset this morning with dry cough .

## 2016-09-03 NOTE — H&P (Signed)
Date: 09/03/2016               Patient Name:  Courtney Dawson MRN: [id number]  DOB: 01-May-1990 Age / Sex: 26 y.o., female   PCP: [facility]         Medical Service: Internal Medicine Teaching Service         Attending Physician: Dr. Evette Doffing, Mallie Mussel, *    First Contact: Dr. Aggie Hacker Pager: [phone]  Second Contact: Dr. 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 She has pain at the site with deep inspiration and when bending over without radiation to her arms, neck, or jaw. She has noticed some swelling around the site without significant erythema. She says she noticed some protrusion of wire at her lower sternum and cut the protuding wire on her own. She has had occasional lightheadedness without syncope. She also reports a cough with occasional scant sputum production. She reports recent diarrhea which appears to be resolving. She denies any fevers, chills, diaphoresis, palpitations, abdominal pain, dysuria, or peripheral edema.  She reports a history of substance use including cocaine, crack cocaine, and heroin with last use in October 2017. She says she has not used any of these substances since that time, but has picked up smoking cigarettes 0.5 PPD instead. She denies alcohol use. She lives in a halfway home in [place].  In the ED, initial vitals were: BP 143/95, Pulse 106, Temp 97.69F, RR 26,  SpO2 99% on RA. Lab work included a CBC within normal limits, K 3.1, Digoxin level <0.2, UDS + for amphetamines (dextromethorphan on home medlist), I-stat Troponin 0.00, beta hCG negative, BNP 85, ESR 13, LA 1.14. A CXR was read as no active cardiopulmonary disease. A CT chest was read as no acute intrathoracic process.   Per my conversation with the ED provider, CVTS, Dr. Cyndia Bent, was consulted and recommended antibiotics for possible cellulitis overlying sternotomy wire with plan to take the patient to the OR this afternoon. Blood cultures were drawn and patient was given IV Clindamycin. IMTS were called for admission.  Meds:  Current Meds  Medication Sig  . albuterol (PROVENTIL HFA;VENTOLIN HFA) 108 (90 Base) MCG/ACT inhaler Inhale 2 puffs into the lungs every 4 (four) hours as needed for wheezing or shortness of breath (or coughing).  Marland Kitchen aspirin EC 81 MG tablet Take 81 mg by mouth daily.  . carvedilol (COREG) 12.5 MG tablet Take 25 mg by mouth 2 (two) times daily with a meal.   . dextromethorphan (DELSYM) 30 MG/5ML liquid Take 30 mg by mouth as needed for cough.  . digoxin (LANOXIN) 0.125 MG tablet Take 0.125 mg by mouth every Monday, Wednesday, and Friday.  . Etonogestrel (IMPLANON [place]) Inject 1 application into the skin once.  . ferrous sulfate 325 (65 FE) MG tablet Take 325 mg by mouth daily with breakfast.  . furosemide (LASIX) 20 MG tablet Take 20  mg by mouth daily.  . hydrOXYzine (ATARAX/VISTARIL) 25 MG tablet Take 1-2 tablets (25-50 mg total) by mouth every 6 (six) hours as needed for anxiety.  . lamoTRIgine (LAMICTAL) 100 MG tablet Take 100 mg by mouth daily.  Marland Kitchen lisinopril (PRINIVIL,ZESTRIL) 2.5 MG tablet Take 2.5 mg by mouth daily.  . sertraline (ZOLOFT) 100 MG tablet Take 100 mg by mouth daily.     Allergies: Allergies as of 09/03/2016 - Review Complete 09/03/2016  Allergen Reaction Noted  . Contrast media [iodinated diagnostic agents] Nausea And Vomiting 02/25/2016  . Ceclor  [cefaclor] Rash 02/25/2016   Past Medical History:  Diagnosis Date  . CHF (congestive heart failure) ([place])   . Heart failure ([place])   . History of endocarditis   . History of substance use   . Pacemaker   . S/P mitral valve replacement with bioprosthetic valve   . S/P tricuspid valve replacement     Family History:  Family History  Problem Relation Age of Onset  . Diabetes Maternal Grandfather      Social History:  Social History   Social History  . Marital status: Single    Spouse name: N/A  . Number of children: N/A  . Years of education: N/A   Social History Main Topics  . Smoking status: Current Some Day Smoker    Packs/day: 0.50    Types: Cigarettes  . Smokeless tobacco: Never Used     Comment: Since October 2017  . Alcohol use No  . Drug use: Yes    Types: Cocaine, "Crack" cocaine, Heroin     Comment: Hx of IVDU; last use October 2017  . Sexual activity: Yes    Birth control/ protection: Implant   Other Topics Concern  . None   Social History Narrative   Lives in [place].     Review of Systems: A complete ROS was negative except as per HPI.   Physical Exam: Blood pressure (!) 103/59, pulse 68, temperature 97.9 F (36.6 C), temperature source Oral, resp. rate 20, height 5' (1.524 m), weight 119 lb 6.4 oz (54.2 kg), SpO2 100 %. Physical Exam  Constitutional: She is oriented to person, place, and time. She appears well-developed and well-nourished. No distress.  HENT:  Head: Normocephalic and atraumatic.  Mouth/Throat: Oropharynx is clear and moist.  Eyes: EOM are normal.  Neck: Normal range of motion.  Cardiovascular: Normal rate, regular rhythm and intact distal pulses.   No murmur heard. PPM right chest wall.  Pulmonary/Chest: Effort normal. No respiratory distress. She has no wheezes.  Faint crackles left lower lung field.  Abdominal: Soft. There is no tenderness.  Musculoskeletal: Normal range of motion. She exhibits no edema.  Mild  tenderness over lower sternum left of the xyphoid process with soft tissue swelling. Suspected sternotomy wire palpated. No erythema, open wound, or discharge.  Neurological: She is alert and oriented to person, place, and time.  Skin: Skin is warm. She is not diaphoretic.  Well healed tracheostomy and sternotomy scars.  Psychiatric: She has a normal mood and affect.     EKG: personally reviewed my interpretation is NSR, rate 96 bpm, RAD, low voltage V1, normal R-wave progression, no acute ischemic changes  CXR: personally reviewed my interpretation is clear lung fields without effusion or consolidation. PPM in place right chest. Sternotomy wires present.  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 Soft tissue Infection: Patient with possible displaced sternotomy wire and soft tissue infection. Per ED provider, Dr. Cyndia Bent with cardiothoracic surgery will plan for further evaluation and management in the OR this afternoon and advised for medical admission with IV antibiotics. She was given one dose of IV Clindamycin. Given her history of endocarditis and self-instrumentation, will switch to Vancomycin for now to cover for MRSA. She is otherwise hemodynamically stable. - f/u Cardiothoracic Surgery recommendations; assistance appreciated - IV Vancomycin - NPO - f/u Blood Cultures   Hx of Endocarditis s/p MV  & TV Replacement and PPM HFrEF This appears to be stable apart from the above issue.  - Resume home Coreg 25 mg BID, Digoxin 0.125 mg every MWF, Lisinopril 2.5 mg daily when no longer NPO  Polysubstance Use Disorder: Patient reports a history of substance use including cocaine, crack cocaine, and heroin with last use in October 2017. She says she has not used any of these substances since that time, but has picked up smoking cigarettes 0.5 PPD instead. She denies alcohol use. - Continues substance use counseling - Consider Suboxone in the future to prevent relapse  Hypokalemia: K 3.1. Repleted. Will monitor and supplement as needed.   Dispo: Admit patient to Inpatient with expected length of stay greater than 2 midnights.  Signed: Zada Finders, MD 09/03/2016, 2:56 PM

## 2016-09-03 NOTE — Progress Notes (Signed)
Pharmacy Antibiotic Note  Courtney Dawson is a 26 y.o. female admitted on 09/03/2016 with possible pacemaker lead infection.  Pharmacy has been consulted for vancomycin dosing. Pt is afebrile and WBC is WNL. Lactic acid and SCr are also normal.   Plan: Vancomycin 1gm IV x 1 then 750mg  IV Q12H F/u renal fxn, C&S, clinical status and trough at SS  Height: 5' (152.4 cm) Weight: 123 lb 0.3 oz (55.8 kg) IBW/kg (Calculated) : 45.5  Temp (24hrs), Avg:97.6 F (36.4 C), Min:97.6 F (36.4 C), Max:97.6 F (36.4 C)   Recent Labs Lab 09/03/16 0642 09/03/16 0931 09/03/16 1053  WBC 10.5  --   --   CREATININE 0.82  --   --   LATICACIDVEN  --  1.14 0.45*    Estimated Creatinine Clearance: 83.5 mL/min (by C-G formula based on SCr of 0.82 mg/dL).    Allergies  Allergen Reactions  . Contrast Media [Iodinated Diagnostic Agents] Nausea And Vomiting  . Ceclor [Cefaclor] Rash    Antimicrobials this admission: Vanc 8/29>>  Dose adjustments this admission: N/A  Microbiology results: Pending  Thank you for allowing pharmacy to be a part of this patient's care.  Courtney Dawson, Courtney Dawson 09/03/2016 12:22 PM

## 2016-09-03 NOTE — ED Provider Notes (Signed)
[facility] DEPT Provider Note   CSN: [id number] Arrival date & time: 09/03/16  0435     History   Chief Complaint Chief Complaint  Patient presents with  . Panic Attack    HPI Courtney Dawson is a 26 y.o. 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  She presents today for chest pressure, possible anxiety attack, palpations, and feeling like her heat was beating "really fast."   Her friend provides additional history that she has been having a dry cough that is especially bad today and she has been taking extra albuterol for her cough. She does not have any known CAD, Last echo on 05/10/16 with a EF of 35-40% and no valve vegetations seen.   She denies any recent drug use since September of 2017.   HPI  Past Medical History:  Diagnosis Date  . CHF (congestive heart failure) ([place])   . Heart failure ([place])   . History of endocarditis   . History of substance use   . Pacemaker   . S/P mitral valve replacement with bioprosthetic valve   . S/P tricuspid valve replacement     Patient Active Problem List   Diagnosis Date Noted  . HFrEF (heart failure with reduced ejection fraction) ([place]) 09/03/2016  . Pacemaker 09/03/2016  . H/O mitral valve replacement 09/03/2016  . H/O tricuspid valve replacement 09/03/2016  . History of heroin abuse 09/03/2016  . Infection of sternotomy closure wire ([place]) 09/03/2016    Past Surgical History:  Procedure Laterality Date  . PACEMAKER INSERTION    . VALVE REPLACEMENT      OB History    Gravida Para Term Preterm AB Living   1             SAB TAB Ectopic Multiple Live Births                   Home Medications    Prior to Admission medications   Medication Sig Start Date End Date Taking? Authorizing Provider  albuterol (PROVENTIL HFA;VENTOLIN HFA) 108 (90  Base) MCG/ACT inhaler Inhale 2 puffs into the lungs every 4 (four) hours as needed for wheezing or shortness of breath (or coughing). 04/15/79  Yes Delora Fuel, MD  aspirin EC 81 MG tablet Take 81 mg by mouth daily.   Yes [provider]  carvedilol (COREG) 12.5 MG tablet Take 25 mg by mouth 2 (two) times daily with a meal.    Yes [provider]  dextromethorphan (DELSYM) 30 MG/5ML liquid Take 30 mg by mouth as needed for cough.   Yes [provider]  digoxin (LANOXIN) 0.125 MG tablet Take 0.125 mg by mouth every Monday, Wednesday, and Friday.   Yes [provider]  Etonogestrel (IMPLANON [place]) Inject 1 application into the skin once.   Yes [provider]  ferrous sulfate 325 (65 FE) MG tablet Take 325 mg by mouth daily with breakfast.   Yes [provider]  furosemide (LASIX) 20 MG tablet Take 20 mg by mouth daily.   Yes [provider]  hydrOXYzine (ATARAX/VISTARIL) 25 MG tablet Take 1-2 tablets (25-50 mg total) by mouth every 6 (six) hours as needed for anxiety. 08/10/16  Yes Pollina, Gwenyth Allegra, MD  lamoTRIgine (LAMICTAL) 100 MG tablet Take 100 mg by mouth daily.   Yes [provider]  lisinopril (  PRINIVIL,ZESTRIL) 2.5 MG tablet Take 2.5 mg by mouth daily.   Yes [provider]  sertraline (ZOLOFT) 100 MG tablet Take 100 mg by mouth daily.   Yes [provider]  Multiple Vitamin (MULTIVITAMIN WITH MINERALS) TABS tablet Take 1 tablet by mouth daily.    [provider]  predniSONE (DELTASONE) 50 MG tablet Take 1 tablet (50 mg total) by mouth daily. 5/57/32   Delora Fuel, MD    Family History Family History  Problem Relation Age of Onset  . Diabetes Maternal Grandfather     Social History Social History  Substance Use Topics  . Smoking status: Current Some Day Smoker    Packs/day: 0.50    Types: Cigarettes  . Smokeless tobacco: Never Used     Comment: Since October 2017  . Alcohol use  No     Allergies   Contrast media [iodinated diagnostic agents] and Ceclor [cefaclor]   Review of Systems Review of Systems  Constitutional: Negative for chills, fatigue and fever.  HENT: Negative for ear pain and sore throat.   Eyes: Negative for pain and visual disturbance.  Respiratory: Positive for cough and chest tightness. Negative for shortness of breath.   Cardiovascular: Positive for chest pain and palpitations. Negative for leg swelling.  Gastrointestinal: Negative for abdominal pain, nausea and vomiting.  Genitourinary: Negative for dysuria and hematuria.  Musculoskeletal: Negative for arthralgias, back pain and neck pain.  Skin: Negative for color change and rash.       Swelling on skin of chest, she suspects its from a sternal wire.   Neurological: Negative for seizures, syncope and headaches.  All other systems reviewed and are negative.    Physical Exam Updated Vital Signs BP (!) 103/59 (BP Location: Left Arm)   Pulse 68   Temp 97.9 F (36.6 C) (Oral)   Resp 20   Ht 5' (1.524 m)   Wt 54.2 kg (119 lb 6.4 oz) Comment: scale A  SpO2 100%   BMI 23.32 kg/m   Physical Exam  Constitutional: She appears well-developed and well-nourished. No distress.  HENT:  Head: Normocephalic and atraumatic.  Eyes: Conjunctivae are normal. Right eye exhibits no discharge. Left eye exhibits no discharge. No scleral icterus.  Neck: Normal range of motion. No JVD present.  Cardiovascular: Normal rate, regular rhythm and intact distal pulses.  Exam reveals no friction rub.   No murmur heard. Pulmonary/Chest: Effort normal and breath sounds normal. No stridor. No respiratory distress. She has no wheezes. She has no rales.    Abdominal: Soft. Bowel sounds are normal. She exhibits no distension. There is no tenderness.  Musculoskeletal: She exhibits no edema or deformity.  Neurological: She is alert. She exhibits normal muscle tone.  Skin: Skin is warm and dry. She is not  diaphoretic.  Psychiatric: She has a normal mood and affect. Her behavior is normal.  Nursing note and vitals reviewed.    ED Treatments / Results  Labs (all labs ordered are listed, but only abnormal results are displayed) Labs Reviewed  COMPREHENSIVE METABOLIC PANEL - Abnormal; Notable for the following:       Result Value   Potassium 3.1 (*)    CO2 21 (*)    Glucose, Bld 109 (*)    Calcium 8.7 (*)    Total Bilirubin 1.3 (*)    All other components within normal limits  DIGOXIN LEVEL - Abnormal; Notable for the following:    Digoxin Level <0.2 (*)    All other components  within normal limits  URINALYSIS, ROUTINE W REFLEX MICROSCOPIC - Abnormal; Notable for the following:    APPearance HAZY (*)    All other components within normal limits  RAPID URINE DRUG SCREEN, HOSP PERFORMED - Abnormal; Notable for the following:    Amphetamines POSITIVE (*)    All other components within normal limits  I-STAT CG4 LACTIC ACID, ED - Abnormal; Notable for the following:    Lactic Acid, Venous 0.45 (*)    All other components within normal limits  CULTURE, BLOOD (ROUTINE X 2)  CULTURE, BLOOD (ROUTINE X 2)  CBC  BRAIN NATRIURETIC PEPTIDE  SEDIMENTATION RATE  HIV ANTIBODY (ROUTINE TESTING)  COMPREHENSIVE METABOLIC PANEL  CBC  I-STAT TROPONIN, ED  I-STAT BETA HCG BLOOD, ED ([facility], [facility], [facility] ONLY)  I-STAT CG4 LACTIC ACID, ED  I-STAT TROPONIN, ED    EKG  EKG Interpretation  Date/Time:  Wednesday September 03 2016 06:25:49 EDT Ventricular Rate:  96 PR Interval:    QRS Duration: 104 QT Interval:  392 QTC Calculation: 496 R Axis:   114 Text Interpretation:  Sinus rhythm Right axis deviation Rate slower Confirmed by Glynn Octave [phone]) on 09/03/2016 6:58:32 AM Also confirmed by Glynn Octave [phone]), editor Laurena Spies [phone])  on 09/03/2016 7:05:10 AM       Radiology Dg Chest 2 View  Result Date: 09/03/2016 CLINICAL DATA:  Chest pain EXAM: CHEST  2 VIEW COMPARISON:   08/10/2016 FINDINGS: Right pacer remains in place, unchanged. Heart and mediastinal contours are within normal limits. No focal opacities or effusions. No acute bony abnormality. IMPRESSION: No active cardiopulmonary disease. Electronically Signed   By: Rolm Baptise M.D.   On: 09/03/2016 07:19   Ct Chest Wo Contrast  Result Date: 09/03/2016 CLINICAL DATA:  Dry cough and shortness of breath. EXAM: CT CHEST WITHOUT CONTRAST TECHNIQUE: Multidetector CT imaging of the chest was performed following the standard protocol without IV contrast. COMPARISON:  CT chest dated February 25, 2016. FINDINGS: Cardiovascular: Right chest wall pacer device is unchanged. Prior mitral and tricuspid valve replacements. Cardiomegaly, unchanged. No pericardial effusion. Abandoned epicardial leads are noted. Normal caliber thoracic aorta. Mediastinum/Nodes: No enlarged mediastinal or axillary lymph nodes. Thyroid gland, trachea, and esophagus demonstrate no significant findings. Lungs/Pleura: Scattered areas of subsegmental atelectasis/scarring are unchanged. No suspicious pulmonary nodule. No consolidation. No pleural effusion or pneumothorax. Upper Abdomen: No acute abnormality. Musculoskeletal: Prior median sternotomy. No acute fracture or significant osseous finding. IMPRESSION: No acute intrathoracic process. No consolidation or pleural effusion. Electronically Signed   By: Titus Dubin M.D.   On: 09/03/2016 09:00    Procedures Procedures (including critical care time)  Medications Ordered in ED Medications  enoxaparin (LOVENOX) injection 40 mg (not administered)  sodium chloride flush (NS) 0.9 % injection 3 mL (3 mLs Intravenous Given 09/03/16 1215)  acetaminophen (TYLENOL) tablet 650 mg (650 mg Oral Given 09/03/16 1235)    Or  acetaminophen (TYLENOL) suppository 650 mg ( Rectal See Alternative 09/03/16 1235)  sodium chloride 0.9 % bolus 1,000 mL (0 mLs Intravenous Stopped 09/03/16 0908)  potassium chloride SA  (K-DUR,KLOR-CON) CR tablet 40 mEq (40 mEq Oral Given 09/03/16 0808)  clindamycin (CLEOCIN) IVPB 600 mg (0 mg Intravenous Stopped 09/03/16 1128)  hydrOXYzine (ATARAX/VISTARIL) tablet 25 mg (25 mg Oral Given 09/03/16 1106)     Initial Impression / Assessment and Plan / ED Course  I have reviewed the triage vital signs and the nursing notes.  Pertinent labs & imaging results that were available during  my care of the patient were reviewed by me and considered in my medical decision making (see chart for details).  Clinical Course as of Sep 03 1744  Wed Sep 03, 2016  0804 Attempted to call lab for the 4th time about bnp, spent 5+ minutes on hold.   [EH]  74 Spoke with Dr. Cyndia Bent office staff. Will speak with Dr. Cyndia Bent once he is out of patient room.   [EH]  1051 Per Dr. Cyndia Bent RN he viewed the CT, plan for admission to hospitalist service and he will evaluate the patient after office hours today, will probably take to [facility] or tomorrow.   [EH]  1107 Spoke with internal medicine teaching service who will come see patient and admit.   [EH]    Clinical Course User Index [EH] Lorin Glass, PA-C   Raechel Chute presents for evaluation of anxiety and a bump on her chest wall, and chest pressure.  Labs were obtained, initial troponin and EKG reviewed, unremarkable.  CXR obtained showing pacer present, no pneumonias, or focal consolidations.  Based on history of endocarditis and CHF, BNP, ESR obtained, blood cultures drawn.  Digoxin level subtherapeutic.  CT chest was obtained and read as unremarkable, however my read shows stranding, inflammation around wire in chest correlating with bump.  CT was performed with out contrast 2/2 allergy.  CT surgery was consulted who requested that she be admitted by medicine and that they will take her to the OR either tonight or tomorrow.    Orders were placed for clindamycin IV for possible MRSA infection.    The patient appears reasonably stabilized for  admission considering the current resources, flow, and capabilities available in the ED at this time, and I doubt any other [facility] requiring further screening and/or treatment in the ED prior to admission.  This patient was seen in a shared visit with Dr. Darl Householder who evaluated the patient and agreed with my plan.    Final Clinical Impressions(s) / ED Diagnoses   Final diagnoses:  Cellulitis of chest wall  H/O tricuspid valve replacement  H/O mitral valve replacement    New Prescriptions Current Discharge Medication List       Ollen Gross 09/03/16 1754    Drenda Freeze, MD 09/04/16 2204

## 2016-09-03 NOTE — ED Notes (Signed)
Patient transported to X-ray 

## 2016-09-04 DIAGNOSIS — Z79899 Other long term (current) drug therapy: Secondary | ICD-10-CM | POA: Diagnosis not present

## 2016-09-04 DIAGNOSIS — I5022 Chronic systolic (congestive) heart failure: Secondary | ICD-10-CM | POA: Diagnosis not present

## 2016-09-04 DIAGNOSIS — Y712 Prosthetic and other implants, materials and accessory cardiovascular devices associated with adverse incidents: Secondary | ICD-10-CM | POA: Diagnosis not present

## 2016-09-04 DIAGNOSIS — T827XXA Infection and inflammatory reaction due to other cardiac and vascular devices, implants and grafts, initial encounter: Secondary | ICD-10-CM | POA: Diagnosis not present

## 2016-09-04 DIAGNOSIS — Z950 Presence of cardiac pacemaker: Secondary | ICD-10-CM | POA: Diagnosis not present

## 2016-09-04 DIAGNOSIS — Z952 Presence of prosthetic heart valve: Secondary | ICD-10-CM | POA: Diagnosis not present

## 2016-09-04 DIAGNOSIS — Z881 Allergy status to other antibiotic agents status: Secondary | ICD-10-CM

## 2016-09-04 DIAGNOSIS — Z8679 Personal history of other diseases of the circulatory system: Secondary | ICD-10-CM | POA: Diagnosis not present

## 2016-09-04 DIAGNOSIS — E876 Hypokalemia: Secondary | ICD-10-CM

## 2016-09-04 DIAGNOSIS — Z91041 Radiographic dye allergy status: Secondary | ICD-10-CM

## 2016-09-04 DIAGNOSIS — L0889 Other specified local infections of the skin and subcutaneous tissue: Secondary | ICD-10-CM

## 2016-09-04 LAB — COMPREHENSIVE METABOLIC PANEL
ALK PHOS: 44 U/L (ref 38–126)
ALT: 12 U/L — AB (ref 14–54)
AST: 19 U/L (ref 15–41)
Albumin: 3.1 g/dL — ABNORMAL LOW (ref 3.5–5.0)
Anion gap: 6 (ref 5–15)
BUN: 12 mg/dL (ref 6–20)
CALCIUM: 8.4 mg/dL — AB (ref 8.9–10.3)
CO2: 21 mmol/L — ABNORMAL LOW (ref 22–32)
CREATININE: 0.71 mg/dL (ref 0.44–1.00)
Chloride: 114 mmol/L — ABNORMAL HIGH (ref 101–111)
Glucose, Bld: 90 mg/dL (ref 65–99)
Potassium: 4 mmol/L (ref 3.5–5.1)
Sodium: 141 mmol/L (ref 135–145)
Total Bilirubin: 0.7 mg/dL (ref 0.3–1.2)
Total Protein: 5.6 g/dL — ABNORMAL LOW (ref 6.5–8.1)

## 2016-09-04 LAB — CBC
HCT: 35.9 % — ABNORMAL LOW (ref 36.0–46.0)
Hemoglobin: 11.3 g/dL — ABNORMAL LOW (ref 12.0–15.0)
MCH: 29.5 pg (ref 26.0–34.0)
MCHC: 31.5 g/dL (ref 30.0–36.0)
MCV: 93.7 fL (ref 78.0–100.0)
Platelets: 199 10*3/uL (ref 150–400)
RBC: 3.83 MIL/uL — ABNORMAL LOW (ref 3.87–5.11)
RDW: 14.6 % (ref 11.5–15.5)
WBC: 7 10*3/uL (ref 4.0–10.5)

## 2016-09-04 LAB — HIV ANTIBODY (ROUTINE TESTING W REFLEX): HIV Screen 4th Generation wRfx: NONREACTIVE

## 2016-09-04 LAB — MRSA PCR SCREENING: MRSA by PCR: NEGATIVE

## 2016-09-04 MED ORDER — AMOXICILLIN-POT CLAVULANATE 875-125 MG PO TABS: 1.0000 | ORAL_TABLET | Freq: Two times a day (BID) | ORAL | 0 refills | Status: AC

## 2016-09-04 NOTE — Discharge Summary (Signed)
Name: Courtney Dawson MRN: [id number] DOB: 07-10-1990 26 y.o. PCP: Practice, [facility]  Date of Admission: 09/03/2016  4:52 AM Date of Discharge: 09/04/2016 Attending Physician: Tyson Alias, *  Discharge Diagnosis: 1. Infected epicardial pacing wire Principal Problem:   Infection of pacemaker lead wire (HCC) Active Problems:   HFrEF (heart failure with reduced ejection fraction) (HCC)   Pacemaker   H/O mitral valve replacement   H/O tricuspid valve replacement   Discharge Medications: Allergies as of 09/04/2016      Reactions   Contrast Media [iodinated Diagnostic Agents] Nausea And Vomiting   Ceclor [cefaclor] Rash      Medication List    TAKE these medications   albuterol 108 (90 Base) MCG/ACT inhaler Commonly known as:  PROVENTIL HFA;VENTOLIN HFA Inhale 2 puffs into the lungs every 4 (four) hours as needed for wheezing or shortness of breath (or coughing).   amoxicillin-clavulanate 875-125 MG tablet Commonly known as:  AUGMENTIN Take 1 tablet by mouth 2 (two) times daily.   aspirin EC 81 MG tablet Take 81 mg by mouth daily.   carvedilol 12.5 MG tablet Commonly known as:  COREG Take 25 mg by mouth 2 (two) times daily with a meal.   DELSYM 30 MG/5ML liquid Generic drug:  dextromethorphan Take 30 mg by mouth as needed for cough.   digoxin 0.125 MG tablet Commonly known as:  LANOXIN Take 0.125 mg by mouth every Monday, Wednesday, and Friday.   ferrous sulfate 325 (65 FE) MG tablet Take 325 mg by mouth daily with breakfast.   furosemide 20 MG tablet Commonly known as:  LASIX Take 20 mg by mouth daily.   hydrOXYzine 25 MG tablet Commonly known as:  ATARAX/VISTARIL Take 1-2 tablets (25-50 mg total) by mouth every 6 (six) hours as needed for anxiety.   IMPLANON [place] Inject 1 application into the skin once.   lamoTRIgine 100 MG tablet Commonly known as:  LAMICTAL Take 100 mg by mouth daily.   lisinopril 2.5 MG tablet Commonly  known as:  PRINIVIL,ZESTRIL Take 2.5 mg by mouth daily.   multivitamin with minerals Tabs tablet Take 1 tablet by mouth daily.   predniSONE 50 MG tablet Commonly known as:  DELTASONE Take 1 tablet (50 mg total) by mouth daily.   sertraline 100 MG tablet Commonly known as:  ZOLOFT Take 100 mg by mouth daily.            Discharge Care Instructions        Start     Ordered   09/04/16 0000  Increase activity slowly     09/04/16 1112   09/04/16 0000  Diet - low sodium heart healthy     09/04/16 1112   09/04/16 0000  Call MD for:  temperature >100.4     09/04/16 1112   09/04/16 0000  Call MD for:  redness, tenderness, or signs of infection (pain, swelling, redness, odor or green/yellow discharge around incision site)     09/04/16 1112   09/04/16 0000  Call MD for:  severe uncontrolled pain     09/04/16 1112   09/04/16 0000  amoxicillin-clavulanate (AUGMENTIN) 875-125 MG tablet  2 times daily     09/04/16 1112   09/04/16 0000  Discharge instructions    Comments:  Courtney Dawson, Courtney Dawson were admitted for a skin infection due to a temporary pacing wire that migrated, and the protruded through your skin. You were given IV antibiotics while hospitalized.  On discharge please continue antibiotics. You have been prescribed Augmentin. Please take Augmentin 875 mg two times daily for a total of five days.  Please follow up with your Cardiothoracic Surgeon at [facility], or with Dr. Laneta Simmers if this occurs again.   09/04/16 1113      Disposition and follow-up:   Courtney Dawson was discharged from [facility] in Good condition.  At the hospital follow up visit please address:  1.  Completion of antibiotic course, resolution of SSTI, further migration of wire through skin, follow up with cardiothoracic surgery  2.  Labs / imaging needed at time of follow-up: none  3.  Pending labs/ test needing follow-up: Blood cultures  Follow-up Appointments:   Hospital  Course by problem list: Principal Problem:   Infection of pacemaker lead wire (HCC) Active Problems:   HFrEF (heart failure with reduced ejection fraction) (HCC)   Pacemaker   H/O mitral valve replacement   H/O tricuspid valve replacement   1. Infection of Temporary Pacemaker Wire  Courtney Dawson was admitted to [facility] and the Internal Medicine Teaching service for soft tissue and skin infection due to migration of a temporary epicardial pacing wire into the subcutaneous and cutaneous tissue. During her hospitalization, vitals signs were stable, she remained afebrile, CBC, and BMET were within normal limits. She was given one dose of IV clindamycin in the emergency department.  Cardiothoracic surgery was consulted and recommended continuation of IV antibiotics. She was treated with IV vancomycin. The swelling around the chest wall protrusion site improved after 1 day of IV antibiotics. She was discharged on Augmentin.   2. HFrEF The patient was continued on her home medication regimen of Coreg 25 mg BID, Digoxin 0.125 mg every MWF, and Lisinopril 2.5 mg.   3.Hypokalemia Serum potassium was 3.1 on admission and with adeqaute repletion returned to normal limits upon discharge.    Discharge Vitals:   BP 100/64 (BP Location: Right Arm)   Pulse 78   Temp 98 F (36.7 C) (Oral)   Resp 20   Ht 5' (1.524 m)   Wt 120 lb 12.8 oz (54.8 kg)   SpO2 98%   BMI 23.59 kg/m   Pertinent Labs, Studies, and Procedures:  CBC Latest Ref Rng & Units 09/04/2016 09/03/2016 08/10/2016  WBC 4.0 - 10.5 K/uL 7.0 10.5 8.2  Hemoglobin 12.0 - 15.0 g/dL 11.3(L) 12.3 14.5  Hematocrit 36.0 - 46.0 % 35.9(L) 38.4 43.2  Platelets 150 - 400 K/uL 199 216 223   BMP Latest Ref Rng & Units 09/04/2016 09/03/2016 08/10/2016  Glucose 65 - 99 mg/dL 90 161(W) 960(A)  BUN 6 - 20 mg/dL 12 15 12   Creatinine 0.44 - 1.00 mg/dL 5.40 9.81 1.91  Sodium 135 - 145 mmol/L 141 138 136  Potassium 3.5 - 5.1 mmol/L 4.0 3.1(L)  3.5  Chloride 101 - 111 mmol/L 114(H) 109 105  CO2 22 - 32 mmol/L 21(L) 21(L) 20(L)  Calcium 8.9 - 10.3 mg/dL 4.7(W) 2.9(F) 9.4    Discharge Instructions: Discharge Instructions    Call MD for:  redness, tenderness, or signs of infection (pain, swelling, redness, odor or green/yellow discharge around incision site)    Complete by:  As directed    Call MD for:  severe uncontrolled pain    Complete by:  As directed    Call MD for:  temperature >100.4    Complete by:  As directed    Diet - low sodium heart healthy  Complete by:  As directed    Discharge instructions    Complete by:  As directed    Courtney Dawson,   You were admitted for a skin infection due to a temporary pacing wire that migrated, and then protruded through your skin. You were given IV antibiotics while hospitalized.    On discharge please continue antibiotics. You have been prescribed Augmentin. Please take Augmentin 875 mg two times daily for a total of five days.  [facility], or with Dr. Laneta SimmersBartle if this occurs again.   Increase activity slowly    Complete by:  As directed       Signed: Toney RakesLacroce, Shirlee Whitmire J, MD 09/04/2016, 2:03 PM   Pager: [phone]

## 2016-09-04 NOTE — Progress Notes (Signed)
[address]       Courtney Dawson [address]             [phone]         Subjective: Feels much better, site is much improved per patient report  Objective: Vital signs in last 24 hours: Temp:  [97.9 F (36.6 C)-98.2 F (36.8 C)] 98 F (36.7 C) (08/30 0618) Pulse Rate:  [68-94] 78 (08/30 0618) Cardiac Rhythm: Atrial paced (08/30 0700) Resp:  [16-27] 20 (08/30 0618) BP: (93-109)/(57-76) 100/64 (08/30 0618) SpO2:  [97 %-100 %] 98 % (08/30 0618) Weight:  [119 lb 6.4 oz (54.2 kg)-123 lb 0.3 oz (55.8 kg)] 120 lb 12.8 oz (54.8 kg) (08/30 0618)  Hemodynamic parameters for last 24 hours:    Intake/Output from previous day: 08/29 0701 - 08/30 0700 In: 1021.7 [P.O.:240; I.V.:631.7; IV Piggyback:150] Out: 0  Intake/Output this shift: No intake/output data recorded.  General appearance: alert, cooperative and no distress Wound: wire protrusion site looks good without cellulitis or drainage, slightly indurated  Lab Results:  Recent Labs  09/03/16 0642 09/04/16 0346  WBC 10.5 7.0  HGB 12.3 11.3*  HCT 38.4 35.9*  PLT 216 199   BMET:  Recent Labs  09/03/16 0642 09/04/16 0346  NA 138 141  K 3.1* 4.0  CL 109 114*  CO2 21* 21*  GLUCOSE 109* 90  BUN 15 12  CREATININE 0.82 0.71  CALCIUM 8.7* 8.4*    PT/INR: No results for input(s): LABPROT, INR in the last 72 hours. ABG No results found for: PHART, HCO3, TCO2, ACIDBASEDEF, O2SAT CBG (last 3)  No results for input(s): GLUCAP in the last 72 hours.  Meds Scheduled Meds: . aspirin EC  81 mg Oral Daily  . carvedilol  25 mg Oral BID WC  . digoxin  0.125 mg Oral Q M,W,F  . enoxaparin (LOVENOX) injection  40 mg Subcutaneous Q24H  . lisinopril  2.5 mg Oral Daily  . sertraline  100 mg Oral Daily  . sodium chloride flush  3 mL Intravenous Q12H   Continuous Infusions: . vancomycin Stopped (09/04/16 0039)   PRN Meds:.acetaminophen **OR** acetaminophen  Xrays Dg Chest 2 View  Result Date:  09/03/2016 CLINICAL DATA:  Chest pain EXAM: CHEST  2 VIEW COMPARISON:  08/10/2016 FINDINGS: Right pacer remains in place, unchanged. Heart and mediastinal contours are within normal limits. No focal opacities or effusions. No acute bony abnormality. IMPRESSION: No active cardiopulmonary disease. Electronically Signed   By: Charlett Nose M.D.   On: 09/03/2016 07:19   Ct Chest Wo Contrast  Result Date: 09/03/2016 CLINICAL DATA:  Dry cough and shortness of breath. EXAM: CT CHEST WITHOUT CONTRAST TECHNIQUE: Multidetector CT imaging of the chest was performed following the standard protocol without IV contrast. COMPARISON:  CT chest dated February 25, 2016. FINDINGS: Cardiovascular: Right chest wall pacer device is unchanged. Prior mitral and tricuspid valve replacements. Cardiomegaly, unchanged. No pericardial effusion. Abandoned epicardial leads are noted. Normal caliber thoracic aorta. Mediastinum/Nodes: No enlarged mediastinal or axillary lymph nodes. Thyroid gland, trachea, and esophagus demonstrate no significant findings. Lungs/Pleura: Scattered areas of subsegmental atelectasis/scarring are unchanged. No suspicious pulmonary nodule. No consolidation. No pleural effusion or pneumothorax. Upper Abdomen: No acute abnormality. Musculoskeletal: Prior median sternotomy. No acute fracture or significant osseous finding. IMPRESSION: No acute intrathoracic process. No consolidation or pleural effusion. Electronically Signed   By: Obie Dredge M.D.   On: 09/03/2016 09:00    Assessment/Plan:  1 Temp pacer wire  site looks fine for discharge from surgical perspective on oral antibiotic  2 she can follow up with her physician or Dr Laneta SimmersBartle as needed  LOS: 1 day    Courtney Dawson 09/04/2016

## 2016-09-04 NOTE — Progress Notes (Signed)
No acute events overnight.  Patient was resting.  Educated about importance of strict I &O however patient is not using provided urine collection hat when voiding.   Will continue to monitor.  Shahan Starks, RN

## 2016-09-04 NOTE — Progress Notes (Signed)
   Subjective: Patient seen and examined. She states her chest discomfort and swelling have improved since admission. She has no other complaints at this time.  Objective:  Vital signs in last 24 hours: Vitals:   09/03/16 1256 09/03/16 2034 09/04/16 0119 09/04/16 0618  BP: (!) 103/59 (!) 99/58 (!) 94/57 100/64  Pulse: 68 77 81 78  Resp: 20 20 (!) 21 20  Temp: 97.9 F (36.6 C) 98 F (36.7 C) 98.2 F (36.8 C) 98 F (36.7 C)  TempSrc: Oral Oral Oral Oral  SpO2: 100% 97% 98% 98%  Weight: 119 lb 6.4 oz (54.2 kg)   120 lb 12.8 oz (54.8 kg)  Height: 5' (1.524 m)      General: Laying in bed comfortably, NAD HEENT: [place]/AT, EOMI, no scleral icterus, PERRL Cardiac: RRR, No R/M/G appreciated Pulm: normal effort, CTAB Abd: soft, non tender, non distended, BS normal Ext: extremities well perfused, no peripheral edema Neuro: alert and oriented X3, cranial nerves II-XII grossly intact Skin: wire protrusion site slightly indurated without cellulitis or drainage  Assessment/Plan:  Principal Problem:   Infection of sternotomy closure wire (HCC) Active Problems:   HFrEF (heart failure with reduced ejection fraction) (HCC)   Pacemaker   H/O mitral valve replacement   H/O tricuspid valve replacement  Migrated and Infected temporary epicardial pacing wire Temporary epicardial pacing wire that gradually migrated to the surface of the skin -->protruded and caused swelling and induration, improved with IV Clindamycin and IV Vancomycin. She is stable for discharge from CT surgery standpoint. Vital signs have been stable, she has been afebrile, WBC 7.0, BMET within normal limits.  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 K 3.1>>4.0.  Dispo: Anticipated discharge today.    Toney RakesLacroce, Yannick Steuber J, MD 09/04/2016, 10:55 AM Pager: [phone]

## 2016-09-04 NOTE — Progress Notes (Signed)
Pt stated that she is going home and she will take her shower then

## 2016-09-08 LAB — CULTURE, BLOOD (ROUTINE X 2)
CULTURE: NO GROWTH
Culture: NO GROWTH
SPECIAL REQUESTS: ADEQUATE
Special Requests: ADEQUATE

## 2016-09-11 ENCOUNTER — Emergency Department (HOSPITAL_COMMUNITY)
Admission: EM | Admit: 2016-09-11 | Discharge: 2016-09-11 | Disposition: A | Discharge status: Home / Self Care | Payer: 59 | Attending: Emergency Medicine | Admitting: Emergency Medicine

## 2016-09-11 ENCOUNTER — Emergency Department (HOSPITAL_COMMUNITY): Payer: 59

## 2016-09-11 ENCOUNTER — Encounter (HOSPITAL_COMMUNITY): Payer: Self-pay | Admitting: Emergency Medicine

## 2016-09-11 DIAGNOSIS — F410 Panic disorder [episodic paroxysmal anxiety] without agoraphobia: Secondary | ICD-10-CM

## 2016-09-11 DIAGNOSIS — R0602 Shortness of breath: Secondary | ICD-10-CM | POA: Insufficient documentation

## 2016-09-11 DIAGNOSIS — Z7982 Long term (current) use of aspirin: Secondary | ICD-10-CM | POA: Diagnosis not present

## 2016-09-11 DIAGNOSIS — Z79899 Other long term (current) drug therapy: Secondary | ICD-10-CM | POA: Insufficient documentation

## 2016-09-11 DIAGNOSIS — Z950 Presence of cardiac pacemaker: Secondary | ICD-10-CM | POA: Insufficient documentation

## 2016-09-11 DIAGNOSIS — F419 Anxiety disorder, unspecified: Secondary | ICD-10-CM | POA: Diagnosis present

## 2016-09-11 DIAGNOSIS — F17210 Nicotine dependence, cigarettes, uncomplicated: Secondary | ICD-10-CM | POA: Insufficient documentation

## 2016-09-11 DIAGNOSIS — I509 Heart failure, unspecified: Secondary | ICD-10-CM | POA: Diagnosis not present

## 2016-09-11 LAB — COMPREHENSIVE METABOLIC PANEL
ALT: 21 U/L (ref 14–54)
ANION GAP: 10 (ref 5–15)
AST: 27 U/L (ref 15–41)
Albumin: 4.5 g/dL (ref 3.5–5.0)
Alkaline Phosphatase: 58 U/L (ref 38–126)
BILIRUBIN TOTAL: 1.3 mg/dL — AB (ref 0.3–1.2)
BUN: 10 mg/dL (ref 6–20)
CO2: 20 mmol/L — ABNORMAL LOW (ref 22–32)
Calcium: 9.3 mg/dL (ref 8.9–10.3)
Chloride: 109 mmol/L (ref 101–111)
Creatinine, Ser: 0.9 mg/dL (ref 0.44–1.00)
GFR calc Af Amer: >60 mL/min (ref >60)
GFR calc non Af Amer: >60 mL/min (ref >60)
GLUCOSE: 102 mg/dL — AB (ref 65–99)
POTASSIUM: 3.3 mmol/L — AB (ref 3.5–5.1)
SODIUM: 139 mmol/L (ref 135–145)
TOTAL PROTEIN: 7.4 g/dL (ref 6.5–8.1)

## 2016-09-11 LAB — CBC WITH DIFFERENTIAL/PLATELET
BASOS PCT: 0 %
Basophils Absolute: 0 10*3/uL (ref 0.0–0.1)
EOS ABS: 0 10*3/uL (ref 0.0–0.7)
Eosinophils Relative: 0 %
HEMATOCRIT: 44.1 % (ref 36.0–46.0)
Hemoglobin: 14.5 g/dL (ref 12.0–15.0)
Lymphocytes Relative: 24 %
Lymphs Abs: 2.5 10*3/uL (ref 0.7–4.0)
MCH: 29.7 pg (ref 26.0–34.0)
MCHC: 32.9 g/dL (ref 30.0–36.0)
MCV: 90.2 fL (ref 78.0–100.0)
MONO ABS: 0.7 10*3/uL (ref 0.1–1.0)
MONOS PCT: 7 %
Neutro Abs: 7 10*3/uL (ref 1.7–7.7)
Neutrophils Relative %: 69 %
Platelets: 269 10*3/uL (ref 150–400)
RBC: 4.89 MIL/uL (ref 3.87–5.11)
RDW: 14.2 % (ref 11.5–15.5)
WBC: 10.3 10*3/uL (ref 4.0–10.5)

## 2016-09-11 LAB — I-STAT TROPONIN, ED
TROPONIN I, POC: 0 ng/mL (ref 0.00–0.08)
Troponin i, poc: 0 ng/mL (ref 0.00–0.08)

## 2016-09-11 LAB — D-DIMER, QUANTITATIVE: D-Dimer, Quant: 0.51 ug/mL-FEU — ABNORMAL HIGH (ref 0.00–0.50)

## 2016-09-11 MED ORDER — IOPAMIDOL (ISOVUE-370) INJECTION 76%
INTRAVENOUS | Status: AC
  Administered 2016-09-11: 100 mL
  Filled 2016-09-11: qty 100

## 2016-09-11 MED ORDER — LORAZEPAM 1 MG PO TABS
1.0000 mg | ORAL_TABLET | Freq: Once | ORAL | Status: AC
  Administered 2016-09-11: 1 mg via ORAL
  Filled 2016-09-11: qty 1

## 2016-09-11 NOTE — ED Notes (Signed)
Returned from CT.

## 2016-09-11 NOTE — ED Triage Notes (Signed)
Per EMS: pt with increased anxiety and noted to be tachy; pt has pacemaker and valve replacements from IVDU

## 2016-09-11 NOTE — ED Notes (Signed)
Patient refused phlebotomy to draw blood. Stated wanted nurse to draw blood.

## 2016-09-11 NOTE — ED Provider Notes (Signed)
[facility]-EMERGENCY DEPT Provider Note   CSN: [id number] Arrival date & time: 09/11/16  0844     History   Chief Complaint Chief Complaint  Patient presents with  . Anxiety    HPI Courtney Dawson is a 26 y.o. female.  HPI   Patient with past medical history of endocarditis (2017) status post mitral valve replacement and tricuspid valve replacement, placement of pacemaker, history of substance abuse, hepatitis C, HFrEF (35-40% TTE 05/10/16), hx of anxiety  And panic attacks presenting with a panic attack. Patient states that last night she woke up from sleep sick on her stomach and threw up 1. She had had a lot of greasy pepperoni chips the night before which could have caused this. She denies any abdominal pain, nausea or vomiting since this event. She states that after she threw up, she felt her pacemaker shock her though she does not have a defillbrillator. She then developed an panic attack with hyperventilation, tingling and numbness in her mouth, palpitations, and chest pain. She took Atrax without much improvement. She state that recently she was admitted for infection of pacemaker leads, receive IV vancomycin. No issues with infection or fever since then. She has not yet followed up with her cardiologist. Patient states that she has not using any drugs.   Past Medical History:  Diagnosis Date  . CHF (congestive heart failure) (HCC)   . Heart failure (HCC)   . History of endocarditis   . History of substance use   . Pacemaker   . S/P mitral valve replacement with bioprosthetic valve   . S/P tricuspid valve replacement     Patient Active Problem List   Diagnosis Date Noted  . 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 HFrEF (heart failure with reduced ejection fraction) (HCC) 09/03/2016  . Pacemaker 09/03/2016  . H/O mitral valve replacement 09/03/2016  . H/O tricuspid valve replacement 09/03/2016  . History of heroin abuse 09/03/2016    Past Surgical History:    Procedure Laterality Date  . PACEMAKER INSERTION    . VALVE REPLACEMENT      OB History    Gravida Para Term Preterm AB Living   1             SAB TAB Ectopic Multiple Live Births                   Home Medications    Prior to Admission medications   Medication Sig Start Date End Date Taking? Authorizing Provider  albuterol (PROVENTIL HFA;VENTOLIN HFA) 108 (90 Base) MCG/ACT inhaler Inhale 2 puffs into the lungs every 4 (four) hours as needed for wheezing or shortness of breath (or coughing). 07/05/16  Yes Dione Booze, MD  aspirin EC 81 MG tablet Take 81 mg by mouth daily.   Yes [provider]  carvedilol (COREG) 12.5 MG tablet Take 25 mg by mouth 2 (two) times daily with a meal.    Yes [provider]  digoxin (LANOXIN) 0.125 MG tablet Take 0.125 mg by mouth every Monday, Wednesday, and Friday.   Yes [provider]  Etonogestrel (IMPLANON [place]) Inject 1 application into the skin once.   Yes [provider]  ferrous sulfate 325 (65 FE) MG tablet Take 325 mg by mouth 2 (two) times daily with a meal.    Yes [provider]  furosemide (LASIX) 20 MG tablet Take 20 mg by mouth daily.   Yes [provider]  hydrOXYzine (ATARAX/VISTARIL) 25 MG tablet  Take 1-2 tablets (25-50 mg total) by mouth every 6 (six) hours as needed for anxiety. 08/10/16  Yes Pollina, Canary Brim, MD  lamoTRIgine (LAMICTAL) 100 MG tablet Take 100 mg by mouth daily.   Yes [provider]  lisinopril (PRINIVIL,ZESTRIL) 2.5 MG tablet Take 2.5 mg by mouth daily.   Yes [provider]  loratadine (CLARITIN) 10 MG tablet Take 10 mg by mouth daily.   Yes [provider]  sertraline (ZOLOFT) 100 MG tablet Take 100 mg by mouth daily.   Yes [provider]    Family History Family History  Problem Relation Age of Onset  . Diabetes Maternal Grandfather     Social History Social History  Substance Use Topics  . Smoking status:  Current Some Day Smoker    Packs/day: 0.50    Types: Cigarettes  . Smokeless tobacco: Never Used     Comment: Since October 2017  . Alcohol use No     Allergies   Contrast media [iodinated diagnostic agents] and Ceclor [cefaclor]   Review of Systems Review of Systems  Constitutional: Negative for chills and fever.  HENT: Negative for congestion.   Respiratory: Positive for chest tightness and shortness of breath.   Cardiovascular: Positive for palpitations. Negative for chest pain.  Gastrointestinal: Negative for nausea and vomiting.  Neurological: Positive for light-headedness. Negative for dizziness, weakness and numbness.  Psychiatric/Behavioral: Positive for agitation.     Physical Exam Updated Vital Signs BP (!) 122/98   Pulse (!) 110   Resp 18   SpO2 100%   Physical Exam  Constitutional: She is oriented to person, place, and time. She appears well-developed and well-nourished.  HENT:  Head: Normocephalic and atraumatic.  Eyes: Pupils are equal, round, and reactive to light. Conjunctivae are normal.  Neck: Normal range of motion. Neck supple.  Cardiovascular:  Tachycardia, regular rate.   Pulmonary/Chest: Breath sounds normal.  tachypnea   Abdominal: Soft. Bowel sounds are normal.  Musculoskeletal: Normal range of motion.  Neurological: She is alert and oriented to person, place, and time.  Skin: Skin is warm. Capillary refill takes less than 2 seconds.  Psychiatric:  Anxious      ED Treatments / Results  Labs (all labs ordered are listed, but only abnormal results are displayed) Labs Reviewed  D-DIMER, QUANTITATIVE (NOT AT [facility]) - Abnormal; Notable for the following:       Result Value   D-Dimer, Quant 0.51 (*)    All other components within normal limits  COMPREHENSIVE METABOLIC PANEL - Abnormal; Notable for the following:    Potassium 3.3 (*)    CO2 20 (*)    Glucose, Bld 102 (*)    Total Bilirubin 1.3 (*)    All other components within normal  limits  CBC WITH DIFFERENTIAL/PLATELET  I-STAT TROPONIN, ED  I-STAT TROPONIN, ED    EKG  EKG Interpretation  Date/Time:  Thursday September 11 2016 08:57:58 EDT Ventricular Rate:  107 PR Interval:    QRS Duration: 96 QT Interval:  349 QTC Calculation: 466 R Axis:   112 Text Interpretation:  Sinus tachycardia Probable left atrial enlargement Right axis deviation Borderline repolarization abnormality Confirmed by Tilden Fossa [phone]) on 09/11/2016 12:40:46 PM       Radiology Dg Chest 1 View  Result Date: 09/11/2016 CLINICAL DATA:  Hx of CHF, endocarditis. Pacemaker present. Hx of substance abuse. MVR/TVR replacement in 2017. Patient is a smoker. EXAM: CHEST 1 VIEW COMPARISON:  09/03/2016 CT and chest x-ray FINDINGS: Status  post median sternotomy. Right-sided transvenous pacemaker leads to the right atrium and right ventricle. Heart size is normal. No focal consolidations or pleural effusions. No pulmonary edema. IMPRESSION: No evidence for acute cardiopulmonary abnormality. Electronically Signed   By: Norva PavlovElizabeth  Brown M.D.   On: 09/11/2016 10:09   Ct Angio Chest Pe W And/or Wo Contrast  Result Date: 09/11/2016 CLINICAL DATA:  Shortness of breath. EXAM: CT ANGIOGRAPHY CHEST WITH CONTRAST TECHNIQUE: Multidetector CT imaging of the chest was performed using the standard protocol during bolus administration of intravenous contrast. Multiplanar CT image reconstructions and MIPs were obtained to evaluate the vascular anatomy. CONTRAST:  60 mL of Isovue 370 intravenously. COMPARISON:  CT scan of September 03, 2016. FINDINGS: Cardiovascular: Satisfactory opacification of the pulmonary arteries to the segmental level. No evidence of pulmonary embolism. Normal heart size. No pericardial effusion. Mediastinum/Nodes: No enlarged mediastinal, hilar, or axillary lymph nodes. Thyroid gland, trachea, and esophagus demonstrate no significant findings. Lungs/Pleura: Lungs are clear. No pleural effusion or  pneumothorax. Upper Abdomen: No acute abnormality. Musculoskeletal: No chest wall abnormality. No acute or significant osseous findings. Review of the MIP images confirms the above findings. IMPRESSION: No definite evidence of pulmonary embolus. No acute cardiopulmonary abnormality seen. Electronically Signed   By: Lupita RaiderJames  Green Jr, M.D.   On: 09/11/2016 13:20    Procedures Procedures (including critical care time)  Medications Ordered in ED Medications  LORazepam (ATIVAN) tablet 1 mg (1 mg Oral Given 09/11/16 0942)  iopamidol (ISOVUE-370) 76 % injection (100 mLs  Contrast Given 09/11/16 1258)     Initial Impression / Assessment and Plan / ED Course  I have reviewed the triage vital signs and the nursing notes.  Pertinent labs & imaging results that were available during my care of the patient were reviewed by me and considered in my medical decision making (see chart for details).  Patient presenting with palpitations and shortness of breath, per patient concern for anxiety attack following her pacemaker buzzing in her early this morning. Patient is visibly anxious there is some concern for drug use as patient has a history of herorine abuse and last UDS was positive for amphetamines thought patient denied any recent drug use. Pacemaker was interrogated which was with normal limits. PE was ruled out with a CT chest following a mildly positive d-dimer. EKG significant for sinus tachycardia with negative troponin consider unlikely for ACS. Electrolytes were stable. No concerns for anemia noted. Patient was treated with Ativan and advised to follow up with PCP and psychiatry. Advised to use Atrax as needed for anxiety.  Final Clinical Impressions(s) / ED Diagnoses   Final diagnoses:  SOB (shortness of breath)  Panic attack    New Prescriptions New Prescriptions   No medications on file     Berton BonMikell, Ashtian Villacis Zahra, MD 09/11/16 1351    Tilden Fossaees, Elizabeth, MD 09/16/16 1309

## 2016-09-11 NOTE — ED Notes (Signed)
Provider at bedside

## 2016-09-11 NOTE — ED Notes (Signed)
[employer] called device stated work fine and this morning heart rate sinus tach 150-160 will send fax. Doctor notified.

## 2016-09-11 NOTE — Discharge Instructions (Signed)
Please follow-up with your primary care physician within the next 3 days. I really recommend that you get a referral to psychiatry as it's important for you to get better control of your anxiety attacks. In the meantime, please use her hydroxyzine as needed

## 2016-10-11 ENCOUNTER — Encounter (HOSPITAL_COMMUNITY): Payer: Self-pay | Admitting: *Deleted

## 2016-10-11 ENCOUNTER — Ambulatory Visit (HOSPITAL_COMMUNITY)
Admission: EM | Admit: 2016-10-11 | Discharge: 2016-10-11 | Disposition: A | Discharge status: Home / Self Care | Payer: 59 | Attending: Family Medicine | Admitting: Family Medicine

## 2016-10-11 DIAGNOSIS — S21109A Unspecified open wound of unspecified front wall of thorax without penetration into thoracic cavity, initial encounter: Secondary | ICD-10-CM

## 2016-10-11 DIAGNOSIS — Z5189 Encounter for other specified aftercare: Secondary | ICD-10-CM | POA: Diagnosis not present

## 2016-10-11 HISTORY — DX: Other psychoactive substance use, unspecified, uncomplicated: F19.90

## 2016-10-11 MED ORDER — SULFAMETHOXAZOLE-TRIMETHOPRIM 800-160 MG PO TABS: 1.0000 | ORAL_TABLET | Freq: Two times a day (BID) | ORAL | 0 refills | Status: AC

## 2016-10-11 MED ORDER — SULFAMETHOXAZOLE-TRIMETHOPRIM 800-160 MG PO TABS: 1.0000 | ORAL_TABLET | Freq: Two times a day (BID) | ORAL | 0 refills | Status: DC

## 2016-10-11 MED ORDER — DICLOFENAC SODIUM 75 MG PO TBEC: 75.0000 mg | DELAYED_RELEASE_TABLET | Freq: Two times a day (BID) | ORAL | 0 refills | Status: DC

## 2016-10-11 MED ORDER — DICLOFENAC SODIUM 75 MG PO TBEC: 75.0000 mg | DELAYED_RELEASE_TABLET | Freq: Two times a day (BID) | ORAL | 0 refills | Status: AC

## 2016-10-11 NOTE — ED Triage Notes (Signed)
Pt reports having pacemaker wires pulled last week; has 2 sutures in place to lower chest/upper abd, along with 1 small open wound - was left open "to get air".  Pt feels wound is starting to get infected.  Also c/o left middle pain without known injury.

## 2016-10-13 NOTE — ED Provider Notes (Signed)
  [facility]   [id number] 10/11/16 Arrival Time: 1935  ASSESSMENT & PLAN:  1. Visit for wound check     Meds ordered this encounter  Medications  . sulfamethoxazole-trimethoprim (BACTRIM DS,SEPTRA DS) 800-160 MG tablet    Sig: Take 1 tablet by mouth 2 (two) times daily.    Dispense:  20 tablet    Refill:  0   Start Bactrim. Recommend that she contact her surgeon. May f/u as needed. Reviewed expectations re: course of current medical issues. Questions answered. Outlined signs and symptoms indicating need for more acute intervention. Patient verbalized understanding. After Visit Summary given.   SUBJECTIVE:  Courtney Dawson is a 26 y.o. female who presents with complaint of possible wound infection. Recent removal of external pacer wires. "A little pus coming from remaining sutures." Minimal discomfort. Afebrile.  ROS: As per HPI.   OBJECTIVE:  Vitals:   10/11/16 1952  BP: 121/81  Pulse: (!) 125  Resp: 16  Temp: 97.7 F (36.5 C)  SpO2: 97%    General appearance: alert; no distress Extremities: no cyanosis or edema; symmetrical with no gross deformities Skin: mid abdominal wound with two sutures and small open part; mild tenderness; dried yellow crusting around remaining sutures Psychological: alert and cooperative; normal mood and affect  Allergies  Allergen Reactions  . Contrast Media [Iodinated Diagnostic Agents] Nausea And Vomiting    PT only has n/v episode x 1. This is an unfortunate side effect, NOT ALLERGY  . Ceclor [Cefaclor] Rash    Past Medical History:  Diagnosis Date  . CHF (congestive heart failure) (HCC)   . Heart failure (HCC)   . History of endocarditis   . History of substance use   . IVDU (intravenous drug user)   . Pacemaker   . S/P mitral valve replacement with bioprosthetic valve   . S/P tricuspid valve replacement    Social History   Social History  . Marital status: Single    Spouse name: N/A  . Number of children: N/A   . Years of education: N/A   Occupational History  . Not on file.   Social History Main Topics  . Smoking status: Former Smoker    Packs/day: 0.50    Types: Cigarettes  . Smokeless tobacco: Never Used     Comment: Since October 2017  . Alcohol use No  . Drug use: Yes    Types: Cocaine, "Crack" cocaine, Heroin     Comment: Hx of IVDU; last use October 2017  . Sexual activity: Not on file   Other Topics Concern  . Not on file   Social History Narrative   Lives in [place].   Family History  Problem Relation Age of Onset  . Diabetes Maternal Grandfather    Past Surgical History:  Procedure Laterality Date  . PACEMAKER INSERTION    . VALVE REPLACEMENT       Mardella Layman, MD 10/13/16 [phone]

## 2016-11-29 ENCOUNTER — Emergency Department (HOSPITAL_COMMUNITY): Payer: 59

## 2016-11-29 ENCOUNTER — Emergency Department (HOSPITAL_COMMUNITY)
Admission: EM | Admit: 2016-11-29 | Discharge: 2016-11-29 | Disposition: A | Discharge status: Home / Self Care | Payer: 59 | Attending: Emergency Medicine | Admitting: Emergency Medicine

## 2016-11-29 ENCOUNTER — Encounter (HOSPITAL_COMMUNITY): Payer: Self-pay | Admitting: Emergency Medicine

## 2016-11-29 DIAGNOSIS — I509 Heart failure, unspecified: Secondary | ICD-10-CM | POA: Insufficient documentation

## 2016-11-29 DIAGNOSIS — F1123 Opioid dependence with withdrawal: Secondary | ICD-10-CM | POA: Diagnosis present

## 2016-11-29 DIAGNOSIS — R0602 Shortness of breath: Secondary | ICD-10-CM | POA: Insufficient documentation

## 2016-11-29 DIAGNOSIS — R110 Nausea: Secondary | ICD-10-CM | POA: Insufficient documentation

## 2016-11-29 DIAGNOSIS — Z7982 Long term (current) use of aspirin: Secondary | ICD-10-CM | POA: Insufficient documentation

## 2016-11-29 DIAGNOSIS — F1410 Cocaine abuse, uncomplicated: Secondary | ICD-10-CM | POA: Insufficient documentation

## 2016-11-29 DIAGNOSIS — Z87891 Personal history of nicotine dependence: Secondary | ICD-10-CM | POA: Diagnosis not present

## 2016-11-29 DIAGNOSIS — Z79899 Other long term (current) drug therapy: Secondary | ICD-10-CM | POA: Insufficient documentation

## 2016-11-29 DIAGNOSIS — Z952 Presence of prosthetic heart valve: Secondary | ICD-10-CM | POA: Diagnosis not present

## 2016-11-29 DIAGNOSIS — F1193 Opioid use, unspecified with withdrawal: Secondary | ICD-10-CM

## 2016-11-29 DIAGNOSIS — Z950 Presence of cardiac pacemaker: Secondary | ICD-10-CM | POA: Insufficient documentation

## 2016-11-29 DIAGNOSIS — R52 Pain, unspecified: Secondary | ICD-10-CM | POA: Diagnosis not present

## 2016-11-29 DIAGNOSIS — F1110 Opioid abuse, uncomplicated: Secondary | ICD-10-CM | POA: Diagnosis not present

## 2016-11-29 LAB — I-STAT BETA HCG BLOOD, ED (MC, WL, AP ONLY): I-stat hCG, quantitative: <5 m[IU]/mL (ref <5)

## 2016-11-29 LAB — HEPATIC FUNCTION PANEL
ALK PHOS: 90 U/L (ref 38–126)
ALT: 16 U/L (ref 14–54)
AST: 24 U/L (ref 15–41)
Albumin: 3.9 g/dL (ref 3.5–5.0)
BILIRUBIN DIRECT: 0.3 mg/dL (ref 0.1–0.5)
BILIRUBIN INDIRECT: 2.2 mg/dL — AB (ref 0.3–0.9)
BILIRUBIN TOTAL: 2.5 mg/dL — AB (ref 0.3–1.2)
Total Protein: 8.9 g/dL — ABNORMAL HIGH (ref 6.5–8.1)

## 2016-11-29 LAB — I-STAT TROPONIN, ED: TROPONIN I, POC: 0 ng/mL (ref 0.00–0.08)

## 2016-11-29 LAB — ETHANOL

## 2016-11-29 LAB — BASIC METABOLIC PANEL
ANION GAP: 13 (ref 5–15)
BUN: 19 mg/dL (ref 6–20)
CALCIUM: 9.4 mg/dL (ref 8.9–10.3)
CO2: 20 mmol/L — ABNORMAL LOW (ref 22–32)
Chloride: 100 mmol/L — ABNORMAL LOW (ref 101–111)
Creatinine, Ser: 0.72 mg/dL (ref 0.44–1.00)
GFR calc Af Amer: >60 mL/min (ref >60)
GLUCOSE: 107 mg/dL — AB (ref 65–99)
POTASSIUM: 3.8 mmol/L (ref 3.5–5.1)
SODIUM: 133 mmol/L — AB (ref 135–145)

## 2016-11-29 LAB — I-STAT CG4 LACTIC ACID, ED: LACTIC ACID, VENOUS: 1.34 mmol/L (ref 0.5–1.9)

## 2016-11-29 LAB — CBC
HEMATOCRIT: 39.5 % (ref 36.0–46.0)
Hemoglobin: 13 g/dL (ref 12.0–15.0)
MCH: 28.3 pg (ref 26.0–34.0)
MCHC: 32.9 g/dL (ref 30.0–36.0)
MCV: 85.9 fL (ref 78.0–100.0)
Platelets: 259 10*3/uL (ref 150–400)
RBC: 4.6 MIL/uL (ref 3.87–5.11)
RDW: 14.1 % (ref 11.5–15.5)
WBC: 8.2 10*3/uL (ref 4.0–10.5)

## 2016-11-29 LAB — ACETAMINOPHEN LEVEL

## 2016-11-29 LAB — SALICYLATE LEVEL: Salicylate Lvl: <7 mg/dL (ref 2.8–30.0)

## 2016-11-29 MED ORDER — SODIUM CHLORIDE 0.9 % IV BOLUS (SEPSIS): 1000.0000 mL | Freq: Once | INTRAVENOUS | Status: DC

## 2016-11-29 MED ORDER — ONDANSETRON 4 MG PO TBDP
ORAL_TABLET | ORAL | Status: AC
  Filled 2016-11-29: qty 1

## 2016-11-29 MED ORDER — ONDANSETRON 4 MG PO TBDP: 4.0000 mg | ORAL_TABLET | Freq: Three times a day (TID) | ORAL | 0 refills | Status: AC | PRN

## 2016-11-29 MED ORDER — ONDANSETRON HCL 4 MG/2ML IJ SOLN
4.0000 mg | Freq: Once | INTRAMUSCULAR | Status: DC
  Filled 2016-11-29: qty 2

## 2016-11-29 MED ORDER — DICYCLOMINE HCL 20 MG PO TABS: 20.0000 mg | ORAL_TABLET | Freq: Two times a day (BID) | ORAL | 0 refills | Status: AC

## 2016-11-29 MED ORDER — IBUPROFEN 400 MG PO TABS: 400.0000 mg | ORAL_TABLET | Freq: Four times a day (QID) | ORAL | 0 refills | Status: AC | PRN

## 2016-11-29 MED ORDER — CLONIDINE HCL 0.1 MG PO TABS
0.1000 mg | ORAL_TABLET | Freq: Once | ORAL | Status: AC
  Administered 2016-11-29: 0.1 mg via ORAL
  Filled 2016-11-29: qty 1

## 2016-11-29 NOTE — ED Notes (Signed)
TTS in progress 
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
## 2016-11-29 NOTE — ED Notes (Signed)
Pt requesting to leave AMA.  EDPA notified. Discharge orders printing.  EDPA spoke with pt about discharge instructions and given prescriptions. Pt unwilling to sign for discharge or wait any longer.

## 2016-11-29 NOTE — ED Notes (Signed)
Attempted IV without success. Assisted pt to call family for place to go after discharge.

## 2016-11-29 NOTE — ED Provider Notes (Signed)
Medical screening examination/treatment/procedure(s) were conducted as a shared visit with non-physician practitioner(s) and myself.  I personally evaluated the patient during the encounter.   EKG Interpretation  Date/Time:  Saturday November 29 2016 17:39:59 EST Ventricular Rate:  106 PR Interval:  210 QRS Duration: 88 QT Interval:  356 QTC Calculation: 472 R Axis:   116 Text Interpretation:  Sinus tachycardia with 1st degree A-V block Right axis deviation Nonspecific T wave abnormality Abnormal ECG No significant change since last tracing Confirmed by Lorre NickAllen, Zyanna Leisinger ([id number]) on 11/29/2016 5:57:7401 PM      26 year old female here requesting detox from heroin.  Had vague suicidal ideations but when seen by behavioral health as well as question by me she denies any suicidal intent at this time.  States that she is not responding to internal stimuli.  Has no plans to harm her self.  Did have emesis x1 here.  Will give IV hydration as well as antiemetics as well as clonidine.  Will give resources for shelters as well as outpatient detox   Lorre NickAllen, Pearlene Teat, MD 11/29/16 2055

## 2016-11-29 NOTE — ED Notes (Signed)
Pt's family requesting via phone that pt be admitted to keep her from doing heroin again.  Advised medical admission was not deemed necessary at this time by ED providers.  Advised pt's symptoms are being treated at this time and she will be given resources upon discharge. Family member irate and inconsolable.  Phone call discontinued by RN

## 2016-11-29 NOTE — ED Notes (Signed)
Patient transported to X-ray 

## 2016-11-29 NOTE — ED Triage Notes (Addendum)
Pt states she is going through withdrawal from heroin-- last used yesterday -- uses 1 gram daily-- had been clean for months - started using in sept. States "I do not want to live"  [place] valve [place] in [place] last year. Also has hx of recent pacemaker wire infection-- treated at [facility]--  C/o hard time breathing, lungs clear

## 2016-11-29 NOTE — ED Notes (Signed)
Pt vomited x 1.  EDP notified

## 2016-11-29 NOTE — ED Provider Notes (Signed)
[facility] EMERGENCY DEPARTMENT Provider Note   CSN: [id number] Arrival date & time: 11/29/16  1712     History   Chief Complaint Chief Complaint  Patient presents with  . Withdrawal    HPI Courtney Dawson is a 26 y.o. female.  Courtney Dawson is a 26 y.o. Female resents to the emergency department complaining of being in withdrawal from heroin today.  Patient tells me she has a long history of heroin use and restarted using heroin in September.  She last used heroin yesterday.  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  She also has a pacemaker. She tells me she hurts all over today. She tells me she does not want to live anymore, but does not have the energy to hurt herself. She complains of generalized pain as well as chest pain, abdominal pain, nausea, shortness of breath.  She reports she vomited yesterday. No treatments prior to arrival today.  She denies fevers, urinary symptoms, diarrhea, neck pain, sore throat, rashes, palpitations.    The history is provided by the patient and medical records. No language interpreter was used.    Past Medical History:  Diagnosis Date  . CHF (congestive heart failure) (HCC)   . Heart failure (HCC)   . History of endocarditis   . History of substance use   . IVDU (intravenous drug user)   . Pacemaker   . S/P mitral valve replacement with bioprosthetic valve   . S/P tricuspid valve replacement     Patient Active Problem List   Diagnosis Date Noted  . 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 HFrEF (heart failure with reduced ejection fraction) (HCC) 09/03/2016  . Pacemaker 09/03/2016  . H/O mitral valve replacement 09/03/2016  . H/O tricuspid valve replacement 09/03/2016  . History of heroin abuse 09/03/2016    Past Surgical History:  Procedure Laterality Date  . PACEMAKER INSERTION    . VALVE REPLACEMENT      OB History    Gravida Para Term  Preterm AB Living   1             SAB TAB Ectopic Multiple Live Births                   Home Medications    Prior to Admission medications   Medication Sig Start Date End Date Taking? Authorizing Provider  albuterol (PROVENTIL HFA;VENTOLIN HFA) 108 (90 Base) MCG/ACT inhaler Inhale 2 puffs into the lungs every 4 (four) hours as needed for wheezing or shortness of breath (or coughing). 07/05/16  Yes Dione Booze, MD  aspirin EC 81 MG tablet Take 81 mg by mouth daily.   Yes [provider]  carvedilol (COREG) 12.5 MG tablet Take 25 mg by mouth 2 (two) times daily with a meal.    Yes [provider]  diclofenac (VOLTAREN) 75 MG EC tablet Take 1 tablet (75 mg total) by mouth 2 (two) times daily. 10/11/16  Yes Mardella Layman, MD  digoxin (LANOXIN) 0.125 MG tablet Take 0.125 mg by mouth every Monday, Wednesday, and Friday.   Yes [provider]  Etonogestrel (IMPLANON [place]) Inject 1 application into the skin once.   Yes [provider]  ferrous sulfate 325 (65 FE) MG tablet Take 325 mg by mouth 2 (two) times daily with a meal.    Yes [provider]  furosemide (LASIX) 20 MG tablet Take 20 mg by mouth daily.  Yes [provider]  hydrOXYzine (ATARAX/VISTARIL) 25 MG tablet Take 1-2 tablets (25-50 mg total) by mouth every 6 (six) hours as needed for anxiety. 08/10/16  Yes Pollina, Canary Brimhristopher J, MD  lamoTRIgine (LAMICTAL) 100 MG tablet Take 100 mg by mouth daily.   Yes [provider]  lisinopril (PRINIVIL,ZESTRIL) 2.5 MG tablet Take 2.5 mg by mouth daily.   Yes [provider]  loratadine (CLARITIN) 10 MG tablet Take 10 mg by mouth daily.   Yes [provider]  sertraline (ZOLOFT) 100 MG tablet Take 100 mg by mouth daily.   Yes [provider]  sulfamethoxazole-trimethoprim (BACTRIM DS,SEPTRA DS) 800-160 MG tablet Take 1 tablet by mouth 2 (two) times daily. 10/11/16  Yes Hagler, Arlys JohnBrian, MD  dicyclomine (BENTYL) 20 MG  tablet Take 1 tablet (20 mg total) by mouth 2 (two) times daily. 11/29/16   Everlene Farrieransie, Kyree Adriano, PA-C  ibuprofen (ADVIL,MOTRIN) 400 MG tablet Take 1 tablet (400 mg total) by mouth every 6 (six) hours as needed. 11/29/16   Everlene Farrieransie, Amanee Iacovelli, PA-C  ondansetron (ZOFRAN ODT) 4 MG disintegrating tablet Take 1 tablet (4 mg total) by mouth every 8 (eight) hours as needed for nausea or vomiting. 11/29/16   Everlene Farrieransie, Hewitt Garner, PA-C    Family History Family History  Problem Relation Age of Onset  . Diabetes Maternal Grandfather     Social History Social History   Tobacco Use  . Smoking status: Former Smoker    Packs/day: 0.50    Types: Cigarettes  . Smokeless tobacco: Never Used  . Tobacco comment: Since October 2017  Substance Use Topics  . Alcohol use: No  . Drug use: Yes    Types: Cocaine, "Crack" cocaine, Heroin    Comment: Hx of IVDU; last used yesterday     Allergies   Contrast media [iodinated diagnostic agents] and Ceclor [cefaclor]   Review of Systems Review of Systems  Constitutional: Positive for chills and fatigue. Negative for fever.  HENT: Negative for congestion and sore throat.   Eyes: Negative for visual disturbance.  Respiratory: Positive for shortness of breath. Negative for cough and wheezing.   Cardiovascular: Positive for chest pain. Negative for palpitations and leg swelling.  Gastrointestinal: Positive for abdominal pain, nausea and vomiting. Negative for diarrhea.  Genitourinary: Negative for difficulty urinating and dysuria.  Musculoskeletal: Negative for back pain and neck pain.  Skin: Negative for rash.  Neurological: Negative for headaches.  Psychiatric/Behavioral: Positive for dysphoric mood and suicidal ideas. Negative for hallucinations.     Physical Exam Updated Vital Signs BP 104/64   Pulse 100   Temp 98.3 F (36.8 C) (Oral)   Resp 20   Ht 5\' 5"  (1.651 m)   Wt 52.2 kg (115 lb)   SpO2 100%   BMI 19.14 kg/m   Physical Exam  Constitutional:  She is oriented to person, place, and time. She appears well-developed and well-nourished. No distress.  Nontoxic appearing.  HENT:  Head: Normocephalic and atraumatic.  Mouth/Throat: Oropharynx is clear and moist.  Eyes: Conjunctivae are normal. Pupils are equal, round, and reactive to light. Right eye exhibits no discharge. Left eye exhibits no discharge.  Neck: Neck supple. No JVD present.  Cardiovascular: Normal rate, regular rhythm, normal heart sounds and intact distal pulses. Exam reveals no gallop and no friction rub.  No murmur heard. Bilateral radial, posterior tibialis and dorsalis pedis pulses are intact.    Pulmonary/Chest: Effort normal and breath sounds normal. No respiratory distress. She has no wheezes. She has no  rales.  Lungs are clear to ascultation bilaterally. Symmetric chest expansion bilaterally. No increased work of breathing. No rales or rhonchi.    Abdominal: Soft. There is no tenderness. There is no guarding.  Musculoskeletal: She exhibits no edema or tenderness.  Lymphadenopathy:    She has no cervical adenopathy.  Neurological: She is alert and oriented to person, place, and time. Coordination normal.  Patient is spontaneously moving all extremities in a coordinated fashion exhibiting good strength.   Skin: Skin is warm and dry. No rash noted. She is not diaphoretic. No erythema. No pallor.  Psychiatric: Her behavior is normal. She exhibits a depressed mood. She expresses suicidal ideation. She expresses no homicidal ideation. She expresses no suicidal plans.  She appears depressed and endorses thoughts of wanting to die. She reports she has no energy to hurt herself.   Nursing note and vitals reviewed.    ED Treatments / Results  Labs (all labs ordered are listed, but only abnormal results are displayed) Labs Reviewed  BASIC METABOLIC PANEL - Abnormal; Notable for the following components:      Result Value   Sodium 133 (*)    Chloride 100 (*)    CO2 20  (*)    Glucose, Bld 107 (*)    All other components within normal limits  HEPATIC FUNCTION PANEL - Abnormal; Notable for the following components:   Total Protein 8.9 (*)    Total Bilirubin 2.5 (*)    Indirect Bilirubin 2.2 (*)    All other components within normal limits  ACETAMINOPHEN LEVEL - Abnormal; Notable for the following components:   Acetaminophen (Tylenol), Serum <10 (*)    All other components within normal limits  CBC  SALICYLATE LEVEL  ETHANOL  RAPID URINE DRUG SCREEN, HOSP PERFORMED  I-STAT TROPONIN, ED  I-STAT CG4 LACTIC ACID, ED  I-STAT BETA HCG BLOOD, ED ([facility], [facility], [facility] ONLY)    EKG  EKG Interpretation  Date/Time:  Saturday November 29 2016 17:39:59 EST Ventricular Rate:  106 PR Interval:  210 QRS Duration: 88 QT Interval:  356 QTC Calculation: 472 R Axis:   116 Text Interpretation:  Sinus tachycardia with 1st degree A-V block Right axis deviation Nonspecific T wave abnormality Abnormal ECG No significant change since last tracing Confirmed by Lorre Nick ([id number]) on 11/29/2016 5:57:01 PM       Radiology Dg Chest 2 View  Result Date: 11/29/2016 CLINICAL DATA:  Chest pain. EXAM: CHEST  2 VIEW COMPARISON:  Radiographs and CT 09/11/2016 FINDINGS: Low lung volumes. Heart size upper normal. Right-sided pacemaker in place with leads projecting over the right atrium and ventricle. Post median sternotomy. Prosthetic mitral and tricuspid valves. No pulmonary edema. Streaky left mid lower lung zone opacities may be atelectasis or scarring. No confluent consolidation. No pleural fluid or pneumothorax. IMPRESSION: Low lung volumes without acute abnormality. Electronically Signed   By: Rubye Oaks M.D.   On: 11/29/2016 18:36    Procedures Procedures (including critical care time)  Medications Ordered in ED Medications  sodium chloride 0.9 % bolus 1,000 mL (not administered)  ondansetron (ZOFRAN-ODT) 4 MG disintegrating tablet (not administered)  cloNIDine  (CATAPRES) tablet 0.1 mg (0.1 mg Oral Given 11/29/16 2130)     Initial Impression / Assessment and Plan / ED Course  I have reviewed the triage vital signs and the nursing notes.  Pertinent labs & imaging results that were available during my care of the patient were reviewed by me and considered in my medical decision making (  see chart for details).    This  is a 26 y.o. Female resents to the emergency department complaining of being in withdrawal from heroin today.  Patient tells me she has a long history of heroin use and restarted using heroin in September.  She last used heroin yesterday.  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  She also has a pacemaker. She tells me she hurts all over today. She tells me she does not want to live anymore, but does not have the energy to hurt herself. She complains of generalized pain as well as chest pain, abdominal pain, nausea, shortness of breath.  She reports she vomited yesterday. On exam the patient is afebrile and nontoxic-appearing.  Lungs are clear to auscultation bilaterally.  No increased work of breathing.  Abdomen is soft and nontender.  Work up here is reassuring.  Normal lactic acid.  Troponin is not elevated.  Chest x-ray is unremarkable. Behavioral health came to evaluate the patient and reports she does not meet inpatient criteria.  They recommend outpatient resources for substance abuse treatment. We confirmed with the patient that she is not suicidal at reevaluation.  She is requesting something for pain.  We will provide her with some medications and attempt p.o. trial as she reports feeling nauseated and then vomited once in the emergency department.  We discussed plan of discharge after this with referral to outpatient resources.  She tells me she is upset that we are not going to keep her for treatment of her addiction.  I advised that we will help her with the appropriate facilities  to get her help with her addiction and that the ER does not do substance abuse therapy.  While awaiting her medications and fluids the patient called an [facility]ber to take her home and she wants to leave. I provided her with prescriptions for ibuprofen, Zofran and Bentyl.  Also provided her with a resource guide for substance abuse treatment centers.  Return precautions discussed.  Patient walked out of the emergency department with her paperwork and prescriptions.  This patient was discussed with and evaluated by Dr. Freida BusmanAllen who agrees with assessment and plan.   Final Clinical Impressions(s) / ED Diagnoses   Final diagnoses:  Heroin withdrawal (HCC)  Heroin abuse [facility](HCC)    ED Discharge Orders        Ordered    ibuprofen (ADVIL,MOTRIN) 400 MG tablet  Every 6 hours PRN     11/29/16 2233    dicyclomine (BENTYL) 20 MG tablet  2 times daily     11/29/16 2233    ondansetron (ZOFRAN ODT) 4 MG disintegrating tablet  Every 8 hours PRN     11/29/16 2233       Everlene FarrierDansie, Jadrien Narine, PA-C 11/30/16 0030

## 2017-01-21 ENCOUNTER — Emergency Department (HOSPITAL_COMMUNITY)
Admission: EM | Admit: 2017-01-21 | Discharge: 2017-01-21 | Disposition: A | Discharge status: Home / Self Care | Payer: 59 | Attending: Emergency Medicine | Admitting: Emergency Medicine

## 2017-01-21 ENCOUNTER — Encounter (HOSPITAL_COMMUNITY): Payer: Self-pay

## 2017-01-21 DIAGNOSIS — R52 Pain, unspecified: Secondary | ICD-10-CM | POA: Diagnosis present

## 2017-01-21 DIAGNOSIS — Z87891 Personal history of nicotine dependence: Secondary | ICD-10-CM | POA: Diagnosis not present

## 2017-01-21 DIAGNOSIS — N3000 Acute cystitis without hematuria: Secondary | ICD-10-CM | POA: Diagnosis not present

## 2017-01-21 DIAGNOSIS — Z7982 Long term (current) use of aspirin: Secondary | ICD-10-CM | POA: Diagnosis not present

## 2017-01-21 DIAGNOSIS — Z95811 Presence of heart assist device: Secondary | ICD-10-CM | POA: Diagnosis not present

## 2017-01-21 DIAGNOSIS — I509 Heart failure, unspecified: Secondary | ICD-10-CM | POA: Diagnosis not present

## 2017-01-21 DIAGNOSIS — Z79899 Other long term (current) drug therapy: Secondary | ICD-10-CM | POA: Insufficient documentation

## 2017-01-21 DIAGNOSIS — Z952 Presence of prosthetic heart valve: Secondary | ICD-10-CM | POA: Diagnosis not present

## 2017-01-21 LAB — RAPID URINE DRUG SCREEN, HOSP PERFORMED
Amphetamines: POSITIVE — AB
Barbiturates: POSITIVE — AB
Benzodiazepines: POSITIVE — AB
COCAINE: POSITIVE — AB
OPIATES: POSITIVE — AB
TETRAHYDROCANNABINOL: POSITIVE — AB

## 2017-01-21 LAB — COMPREHENSIVE METABOLIC PANEL
ALBUMIN: 4.4 g/dL (ref 3.5–5.0)
ALK PHOS: 80 U/L (ref 38–126)
ALT: 17 U/L (ref 14–54)
AST: 31 U/L (ref 15–41)
Anion gap: 12 (ref 5–15)
BUN: 17 mg/dL (ref 6–20)
CALCIUM: 9.7 mg/dL (ref 8.9–10.3)
CO2: 22 mmol/L (ref 22–32)
CREATININE: 0.95 mg/dL (ref 0.44–1.00)
Chloride: 103 mmol/L (ref 101–111)
GFR calc Af Amer: >60 mL/min (ref >60)
GFR calc non Af Amer: >60 mL/min (ref >60)
GLUCOSE: 97 mg/dL (ref 65–99)
Potassium: 3.9 mmol/L (ref 3.5–5.1)
SODIUM: 137 mmol/L (ref 135–145)
Total Bilirubin: 1.8 mg/dL — ABNORMAL HIGH (ref 0.3–1.2)
Total Protein: 8.3 g/dL — ABNORMAL HIGH (ref 6.5–8.1)

## 2017-01-21 LAB — I-STAT BETA HCG BLOOD, ED (MC, WL, AP ONLY): I-stat hCG, quantitative: <5 m[IU]/mL (ref <5)

## 2017-01-21 LAB — URINALYSIS, ROUTINE W REFLEX MICROSCOPIC
Bilirubin Urine: NEGATIVE
GLUCOSE, UA: NEGATIVE mg/dL
Ketones, ur: 5 mg/dL — AB
NITRITE: NEGATIVE
PROTEIN: 30 mg/dL — AB
Specific Gravity, Urine: 1.027 (ref 1.005–1.030)
pH: 5 (ref 5.0–8.0)

## 2017-01-21 LAB — CBC WITH DIFFERENTIAL/PLATELET
BASOS PCT: 0 %
Basophils Absolute: 0 10*3/uL (ref 0.0–0.1)
EOS ABS: 0.1 10*3/uL (ref 0.0–0.7)
Eosinophils Relative: 2 %
HEMATOCRIT: 36.8 % (ref 36.0–46.0)
Hemoglobin: 12.2 g/dL (ref 12.0–15.0)
Lymphocytes Relative: 30 %
Lymphs Abs: 2.6 10*3/uL (ref 0.7–4.0)
MCH: 27.9 pg (ref 26.0–34.0)
MCHC: 33.2 g/dL (ref 30.0–36.0)
MCV: 84 fL (ref 78.0–100.0)
MONO ABS: 0.8 10*3/uL (ref 0.1–1.0)
MONOS PCT: 9 %
Neutro Abs: 5.3 10*3/uL (ref 1.7–7.7)
Neutrophils Relative %: 59 %
Platelets: 275 10*3/uL (ref 150–400)
RBC: 4.38 MIL/uL (ref 3.87–5.11)
RDW: 15.3 % (ref 11.5–15.5)
WBC: 8.9 10*3/uL (ref 4.0–10.5)

## 2017-01-21 LAB — I-STAT CG4 LACTIC ACID, ED: Lactic Acid, Venous: 1.03 mmol/L (ref 0.5–1.9)

## 2017-01-21 MED ORDER — CIPROFLOXACIN HCL 500 MG PO TABS: 500.0000 mg | ORAL_TABLET | Freq: Two times a day (BID) | ORAL | 0 refills | Status: AC

## 2017-01-21 NOTE — ED Provider Notes (Signed)
[facility]-EMERGENCY DEPT Provider Note   CSN: [id number] Arrival date & time: 01/21/17  0019     History   Chief Complaint Chief Complaint  Patient presents with  . generalized pain    HPI Courtney Dawson is a 27 y.o. female.  Patient is a 27 year old female with past medical history of IV drug abuse leading to replacements of both the mitral and tricuspid valves along with cardiomyopathy with defibrillator.  She presents today for evaluation of possible sepsis.  She tells me that she was seen 2-1/2 weeks ago at [facility].  She has been out of touch and has not been taking calls from her mother recently.  Berton Lan apparently called her mother's house and told her that she needed to come to the hospital and that she might be septic.  The patient tells me that she was prescribed antibiotics at that hospital stay and is now feeling fine.  She denies any fevers, weakness, chills, or any other complaints.   The history is provided by the patient.    Past Medical History:  Diagnosis Date  . CHF (congestive heart failure) (HCC)   . Heart failure (HCC)   . History of endocarditis   . History of substance use   . IVDU (intravenous drug user)   . Pacemaker   . S/P mitral valve replacement with bioprosthetic valve   . S/P tricuspid valve replacement     Patient Active Problem List   Diagnosis Date Noted  . 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 HFrEF (heart failure with reduced ejection fraction) (HCC) 09/03/2016  . Pacemaker 09/03/2016  . H/O mitral valve replacement 09/03/2016  . H/O tricuspid valve replacement 09/03/2016  . History of heroin abuse 09/03/2016    Past Surgical History:  Procedure Laterality Date  . PACEMAKER INSERTION    . VALVE REPLACEMENT      OB History    Gravida Para Term Preterm AB Living   1             SAB TAB Ectopic Multiple Live Births                   Home Medications    Prior to Admission  medications   Medication Sig Start Date End Date Taking? Authorizing Provider  albuterol (PROVENTIL HFA;VENTOLIN HFA) 108 (90 Base) MCG/ACT inhaler Inhale 2 puffs into the lungs every 4 (four) hours as needed for wheezing or shortness of breath (or coughing). 07/05/16   Dione Booze, MD  aspirin EC 81 MG tablet Take 81 mg by mouth daily.    [provider]  carvedilol (COREG) 12.5 MG tablet Take 25 mg by mouth 2 (two) times daily with a meal.     [provider]  diclofenac (VOLTAREN) 75 MG EC tablet Take 1 tablet (75 mg total) by mouth 2 (two) times daily. 10/11/16   Mardella Layman, MD  dicyclomine (BENTYL) 20 MG tablet Take 1 tablet (20 mg total) by mouth 2 (two) times daily. 11/29/16   Everlene Farrier, PA-C  digoxin (LANOXIN) 0.125 MG tablet Take 0.125 mg by mouth every Monday, Wednesday, and Friday.    [provider]  Etonogestrel (IMPLANON [place]) Inject 1 application into the skin once.    [provider]  ferrous sulfate 325 (65 FE) MG tablet Take 325 mg by mouth 2 (two) times daily with a meal.     [provider]  furosemide (LASIX) 20 MG tablet Take 20 mg  by mouth daily.    [provider]  hydrOXYzine (ATARAX/VISTARIL) 25 MG tablet Take 1-2 tablets (25-50 mg total) by mouth every 6 (six) hours as needed for anxiety. 08/10/16   [name], MD  ibuprofen (ADVIL,MOTRIN) 400 MG tablet Take 1 tablet (400 mg total) by mouth every 6 (six) hours as needed. 11/29/16   Everlene Farrier, PA-C  lamoTRIgine (LAMICTAL) 100 MG tablet Take 100 mg by mouth daily.    [provider]  lisinopril (PRINIVIL,ZESTRIL) 2.5 MG tablet Take 2.5 mg by mouth daily.    [provider]  loratadine (CLARITIN) 10 MG tablet Take 10 mg by mouth daily.    [provider]  ondansetron (ZOFRAN ODT) 4 MG disintegrating tablet Take 1 tablet (4 mg total) by mouth every 8 (eight) hours as needed for nausea or vomiting. 11/29/16   Everlene Farrier,  PA-C  sertraline (ZOLOFT) 100 MG tablet Take 100 mg by mouth daily.    [provider]  sulfamethoxazole-trimethoprim (BACTRIM DS,SEPTRA DS) 800-160 MG tablet Take 1 tablet by mouth 2 (two) times daily. 10/11/16   Mardella Layman, MD    Family History Family History  Problem Relation Age of Onset  . Diabetes Maternal Grandfather     Social History Social History   Tobacco Use  . Smoking status: Former Smoker    Packs/day: 0.50    Types: Cigarettes  . Smokeless tobacco: Never Used  . Tobacco comment: Since October 2017  Substance Use Topics  . Alcohol use: No  . Drug use: Yes    Types: Cocaine, "Crack" cocaine, Heroin    Comment: Hx of IVDU; last used yesterday     Allergies   Contrast media [iodinated diagnostic agents] and Ceclor [cefaclor]   Review of Systems Review of Systems  All other systems reviewed and are negative.    Physical Exam Updated Vital Signs BP (!) 123/107 (BP Location: Right Arm)   Pulse (!) 51   Temp 99.1 F (37.3 C) (Oral)   Resp (!) 21   SpO2 91%   Physical Exam  Constitutional: She is oriented to person, place, and time. She appears well-developed and well-nourished. No distress.  HENT:  Head: Normocephalic and atraumatic.  Mouth/Throat: Oropharynx is clear and moist.  Neck: Normal range of motion. Neck supple.  Cardiovascular: Normal rate and regular rhythm. Exam reveals no gallop and no friction rub.  No murmur heard. Pulmonary/Chest: Effort normal and breath sounds normal. No respiratory distress. She has no wheezes.  Abdominal: Soft. Bowel sounds are normal. She exhibits no distension. There is no tenderness.  Musculoskeletal: Normal range of motion.  Neurological: She is alert and oriented to person, place, and time.  Skin: Skin is warm and dry. She is not diaphoretic.  Nursing note and vitals reviewed.    ED Treatments / Results  Labs (all labs ordered are listed, but only abnormal results are displayed) Labs  Reviewed  CULTURE, BLOOD (ROUTINE X 2)  CULTURE, BLOOD (ROUTINE X 2)  CBC WITH DIFFERENTIAL/PLATELET  COMPREHENSIVE METABOLIC PANEL  URINALYSIS, ROUTINE W REFLEX MICROSCOPIC  RAPID URINE DRUG SCREEN, HOSP PERFORMED  I-STAT CG4 LACTIC ACID, ED  I-STAT BETA HCG BLOOD, ED ([facility], [facility], [facility] ONLY)    EKG  EKG Interpretation None       Radiology No results found.  Procedures Procedures (including critical care time)  Medications Ordered in ED Medications - No data to display   Initial Impression / Assessment and Plan / ED Course  I have reviewed  the triage vital signs and the nursing notes.  Pertinent labs & imaging results that were available during my care of the patient were reviewed by me and considered in my medical decision making (see chart for details).  Patient presents for evaluation of possible positive blood cultures [facility]  She has a history of intravenous drug abuse with mitral and tricuspid valve replacements.  I was unable to find the abnormality she describes in the computer system, however today she does not appear septic.  Her lactate is normal, she is afebrile, there is no white count, and she appears clinically well.  Cultures of the blood and urine were obtained and are pending.  She does have evidence for a UTI and her urinalysis which will be treated with Cipro.  She is to return as needed for any problems.  Final Clinical Impressions(s) / ED Diagnoses   Final diagnoses:  None    ED Discharge Orders    None       Geoffery Lyonselo, Tevin Shillingford, MD 01/21/17 [phone]

## 2017-01-21 NOTE — ED Notes (Signed)
Bed: [facility]1 Expected date:  Expected time:  Means of arrival:  Comments: 

## 2017-01-21 NOTE — ED Triage Notes (Signed)
Pt states that she was seen at [facility] last week and dx with a UTI, then they called her Monday and told her to come back because she was septic, Pt is very erratic in her story and can't be still She said that she didn't have her phone on for two days and when she got the message she decided to come here because [place] in [place] Pt has a general complaint of pain but unable to localize it

## 2017-01-21 NOTE — Discharge Instructions (Signed)
Cipro as prescribed.    We will call you if your cultures indicate you require further treatment or action.

## 2017-01-26 LAB — CULTURE, BLOOD (ROUTINE X 2)
Culture: NO GROWTH
Culture: NO GROWTH
SPECIAL REQUESTS: ADEQUATE
SPECIAL REQUESTS: ADEQUATE

## 2018-06-27 IMAGING — CR DG CHEST 2V
2 series · 2 of 2 positions shown · non-contrast
Comparison: None.

CLINICAL DATA: Left-sided chest pain with shortness of breath

EXAM:
CHEST  2 VIEW

[chest pa]
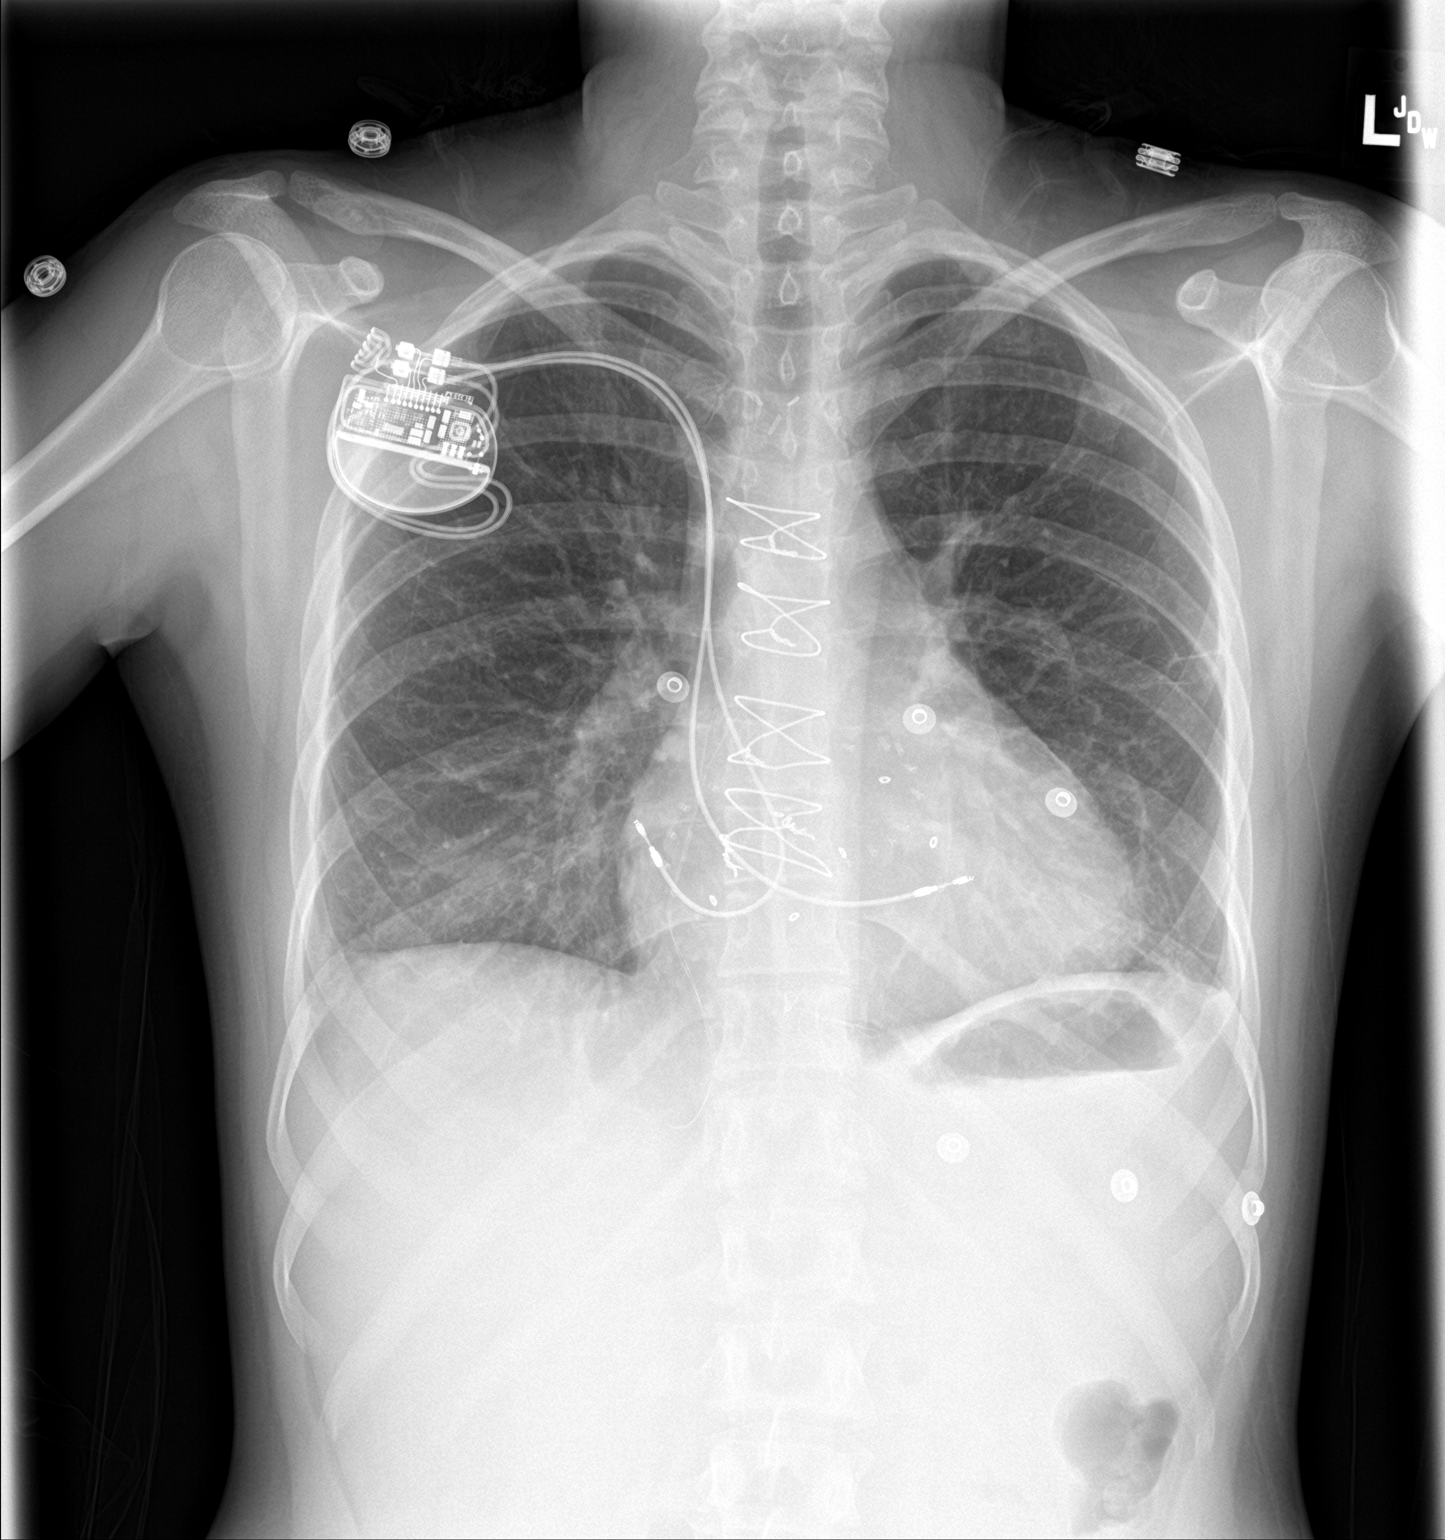

[chest lat]
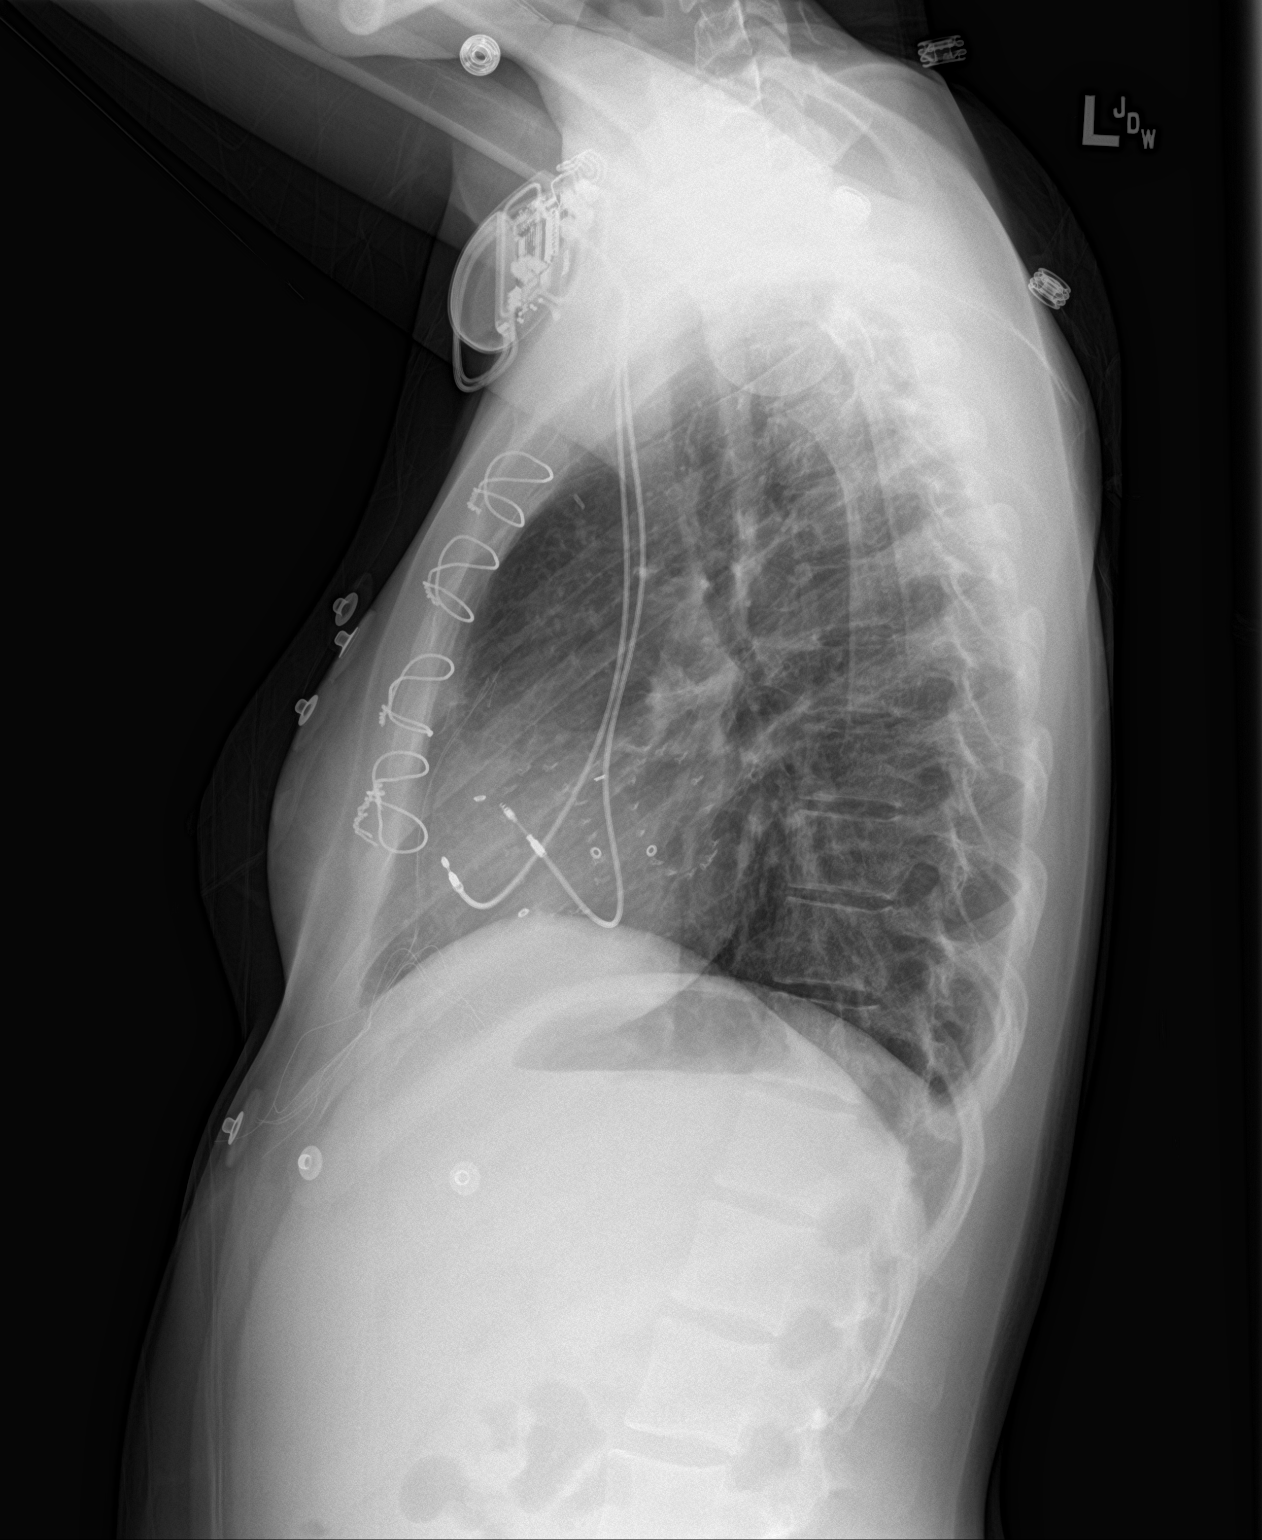

[2 of 2 positions shown; findings below may reference images not displayed]

FINDINGS: Right-sided duo lead pacing device. Post sternotomy changes with
valvular prosthesis. Additional linear leads overlie the lower
anterior chest. No acute consolidation or large pleural effusion.
There is mild cardiomegaly but no overt failure. There is no
pneumothorax.
IMPRESSION: Mild cardiomegaly.  No edema.  No acute infiltrate.

## 2018-09-11 IMAGING — DX DG CHEST 2V
2 series · 2 of 2 positions shown · non-contrast
Comparison: Radiographs 02/24/2016.  CT 02/25/2016.

CLINICAL DATA: Chest pain with shortness of breath today. Back pain
for several days.

EXAM:
CHEST  2 VIEW

[w chest pa]
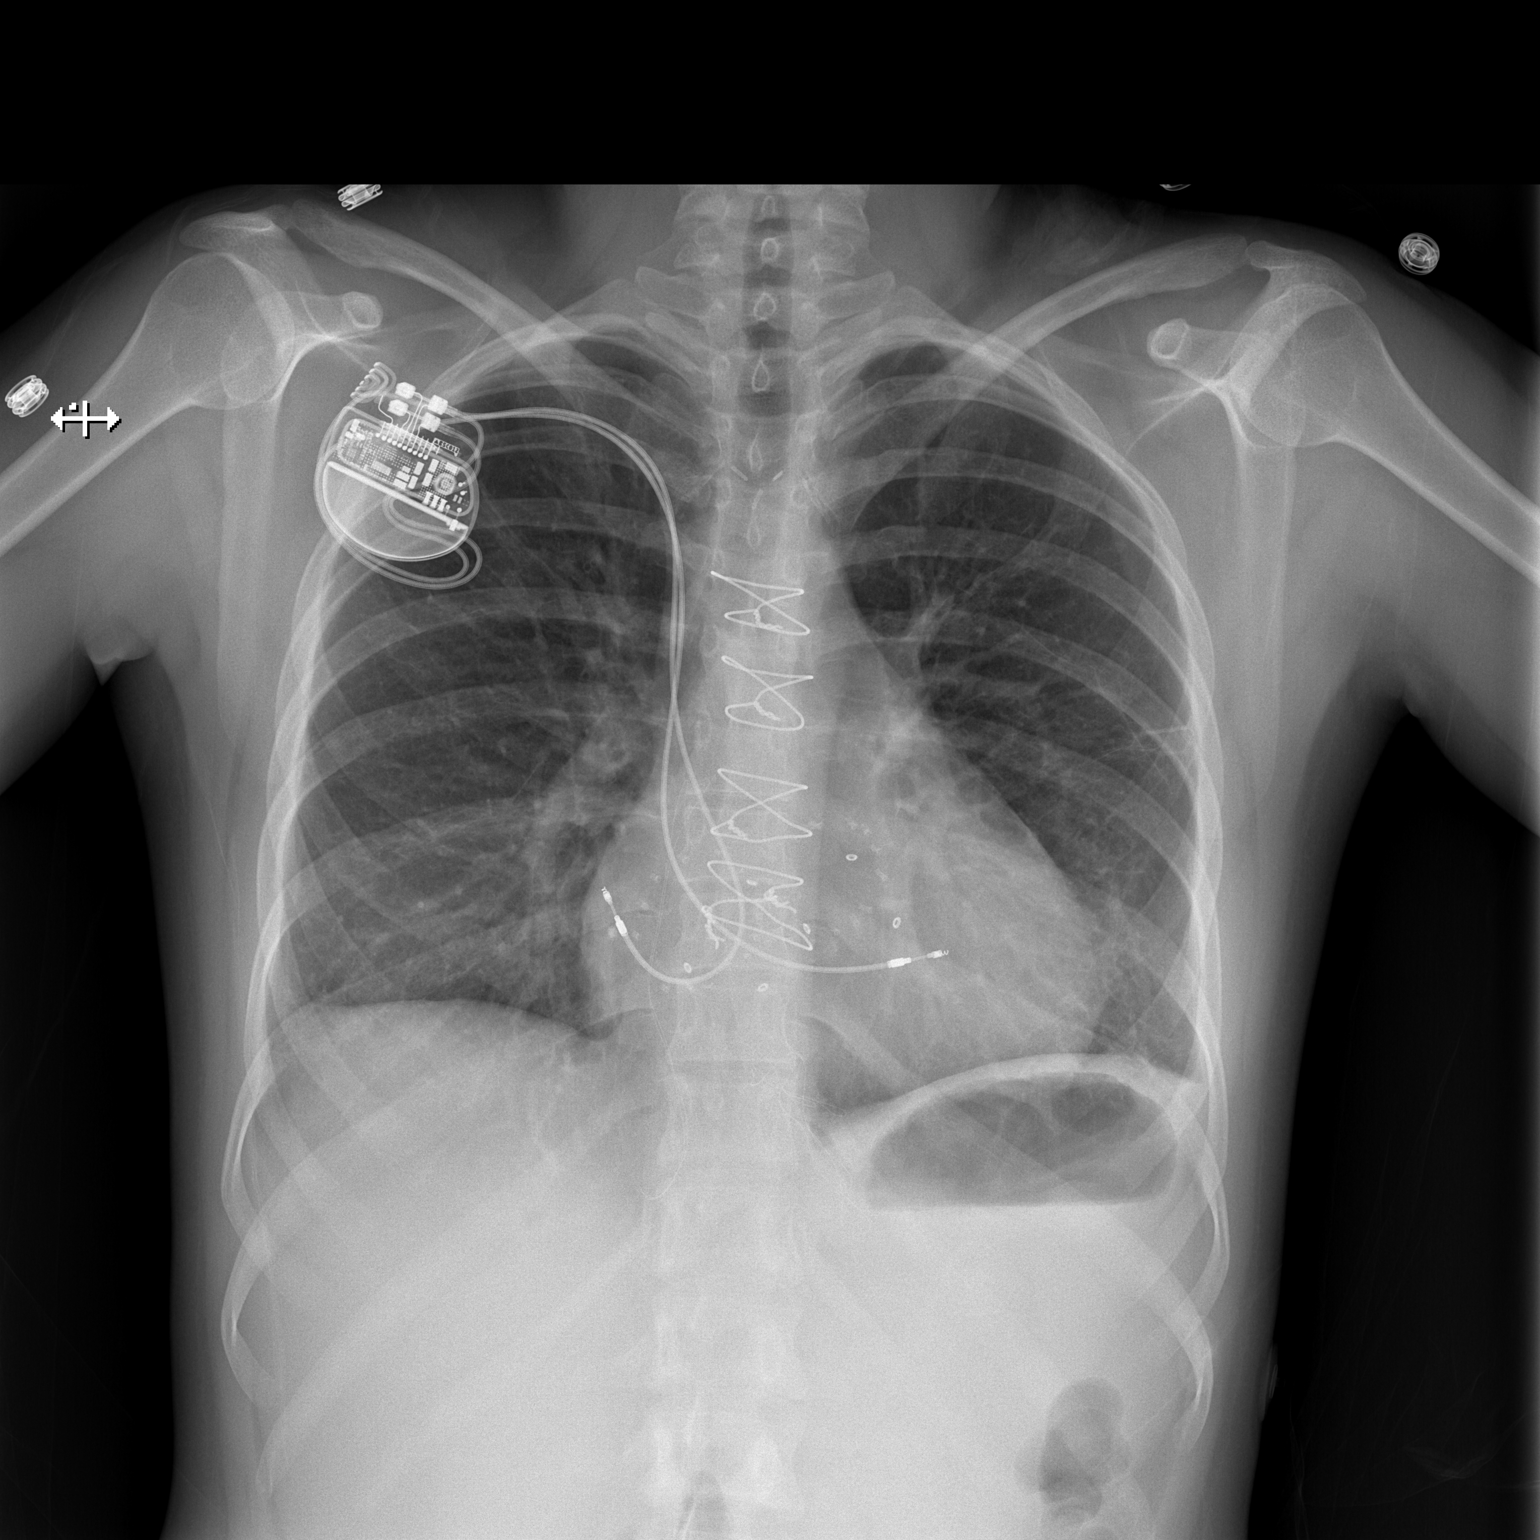

[w chest lat]
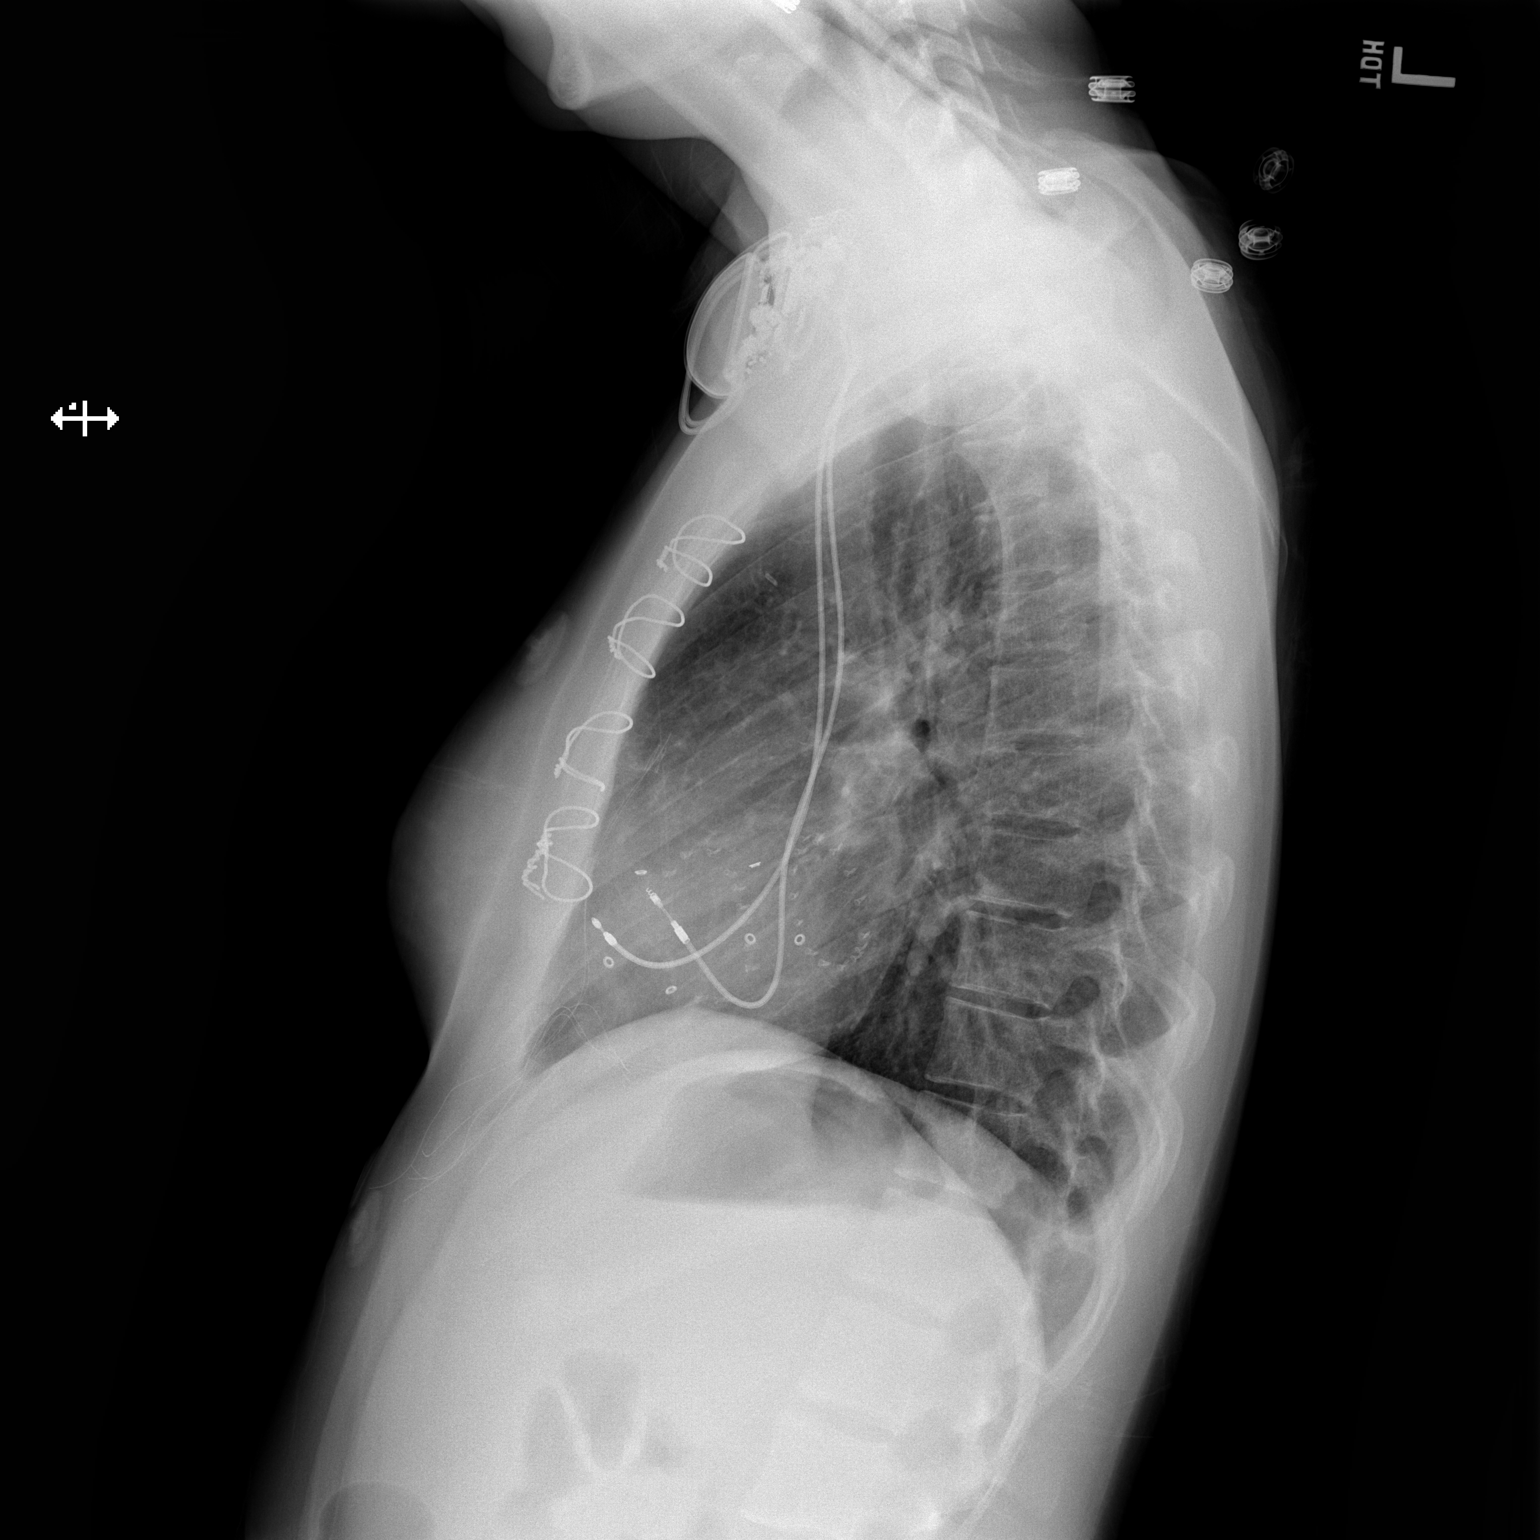

[2 of 2 positions shown; findings below may reference images not displayed]

FINDINGS: The heart size and mediastinal contours are stable. Right subclavian
pacemaker and epicardial leads appear unchanged. There is stable
linear scarring or atelectasis at both lung bases. There is stable
mild blunting of the costophrenic angles. No enlarging pleural
effusion or pneumothorax. The bones appear unchanged.
IMPRESSION: Stable postoperative chest.  No acute cardiopulmonary process.

## 2019-01-13 IMAGING — DX DG CHEST 1V
1 series · 1 of 1 positions shown · non-contrast
Comparison: 09/03/2016 CT and chest x-ray

CLINICAL DATA: Hx of CHF, endocarditis. Pacemaker present. Hx of
substance abuse. MVR/TVR replacement in 6150. Patient is a smoker.

EXAM:
CHEST 1 VIEW

[chest ap]
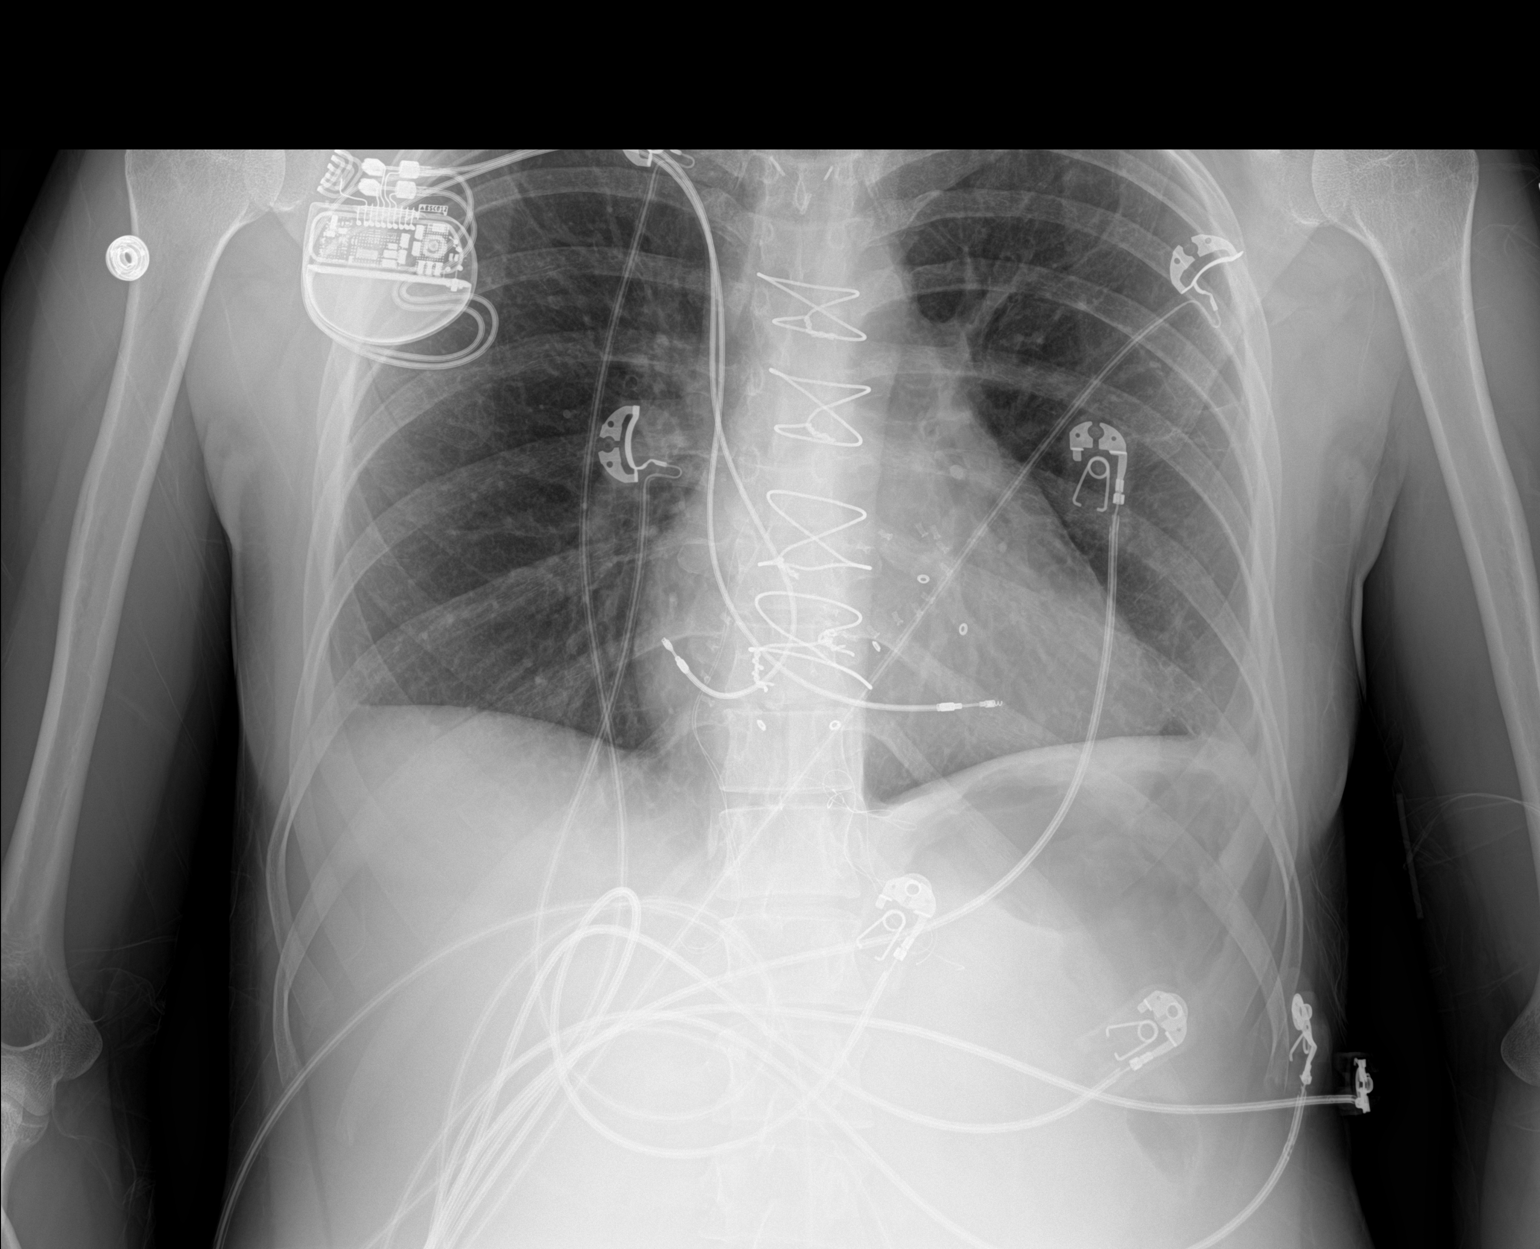

[1 of 1 positions shown; findings below may reference images not displayed]

FINDINGS: Status post median sternotomy. Right-sided transvenous pacemaker
leads to the right atrium and right ventricle. Heart size is normal.
No focal consolidations or pleural effusions. No pulmonary edema.
IMPRESSION: No evidence for acute cardiopulmonary abnormality.

## 2019-08-01 ENCOUNTER — Inpatient Hospital Stay: Admission: EM | Admit: 2019-08-01 | Payer: 59 | Source: Other Acute Inpatient Hospital | Admitting: Family Medicine
